# Patient Record
Sex: Female | Born: 1985 | Race: Black or African American | Hispanic: No | Marital: Single | Smoking: Current some day smoker
Health system: Southern US, Community
[De-identification: ages and names within clinical notes are randomized; demographics above are authoritative.]

## PROBLEM LIST (undated history)

## (undated) HISTORY — PX: ECTOPIC PREGNANCY SURGERY: SHX613

---
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
## 2006-12-25 ENCOUNTER — Ambulatory Visit (HOSPITAL_COMMUNITY): Admission: RE | Admit: 2006-12-25 | Discharge: 2006-12-25 | Payer: Self-pay | Admitting: Obstetrics and Gynecology

## 2006-12-25 ENCOUNTER — Inpatient Hospital Stay (HOSPITAL_COMMUNITY): Admission: AD | Admit: 2006-12-25 | Discharge: 2006-12-26 | Payer: Self-pay | Admitting: Obstetrics and Gynecology

## 2007-05-17 ENCOUNTER — Encounter: Payer: Self-pay | Admitting: Obstetrics and Gynecology

## 2007-05-17 ENCOUNTER — Inpatient Hospital Stay (HOSPITAL_COMMUNITY): Admission: EM | Admit: 2007-05-17 | Discharge: 2007-05-18 | Payer: Self-pay | Admitting: Emergency Medicine

## 2008-06-30 IMAGING — US US OB COMP LESS 14 WK
1 series · 14 of 28 positions shown · non-contrast
Comparison: none

CLINICAL DATA: 20-year-old female, abdominal pain, positive pregnancy test.  The patient?s beta hCG is 3823.
TRANSABDOMINAL AND TRANSVAGINAL PELVIC ULTRASOUND:
TECHNIQUE: Both transabdominal and transvaginal ultrasound examinations of the pelvis were performed including evaluation of the uterus, ovaries, adnexal regions, and pelvic cul-de-sac.

[Series 1: us ob comp less 14 wk · 0.28mm/px · 139 acquisitions, 14 frames shown]
[im 6/139]
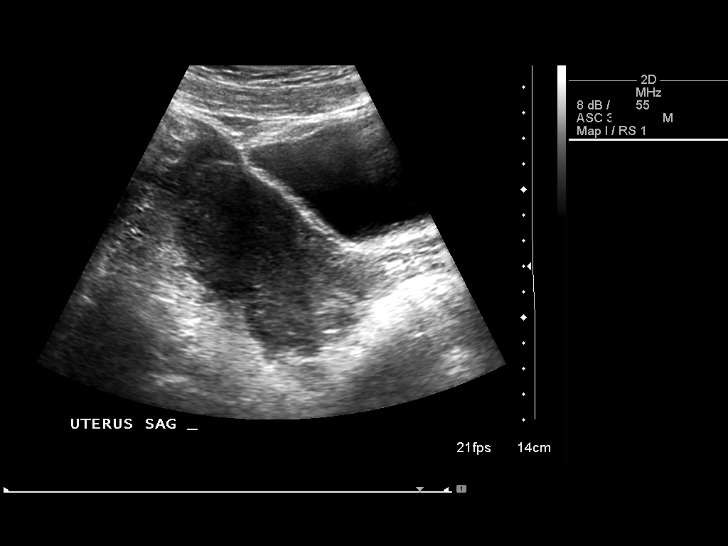
[im 16/139]
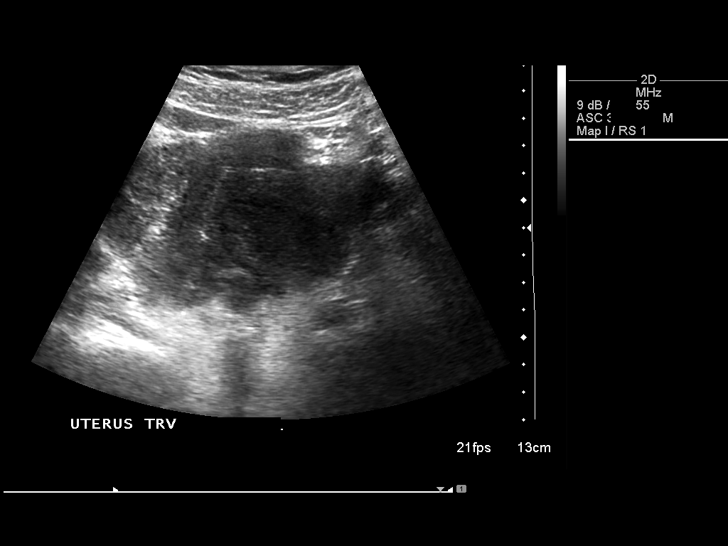
[im 26/139]
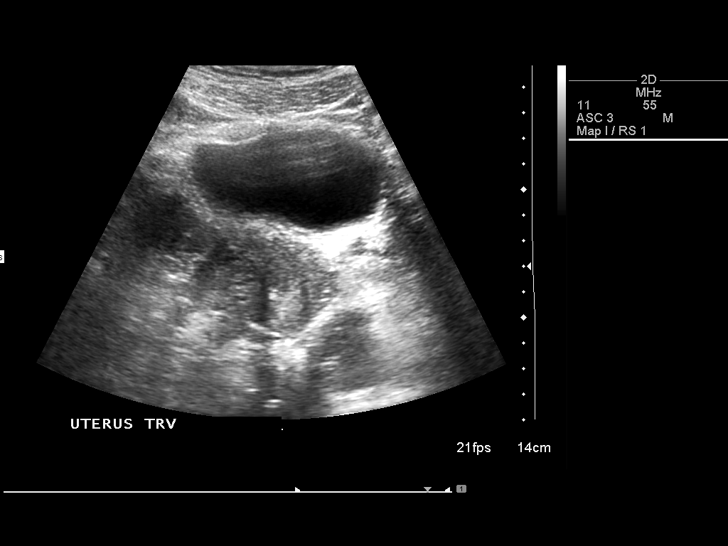
[im 36/139]
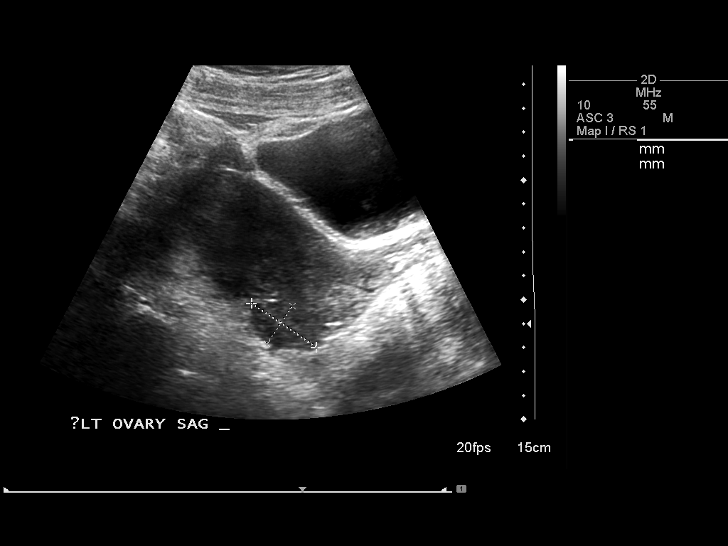
[im 47/139]
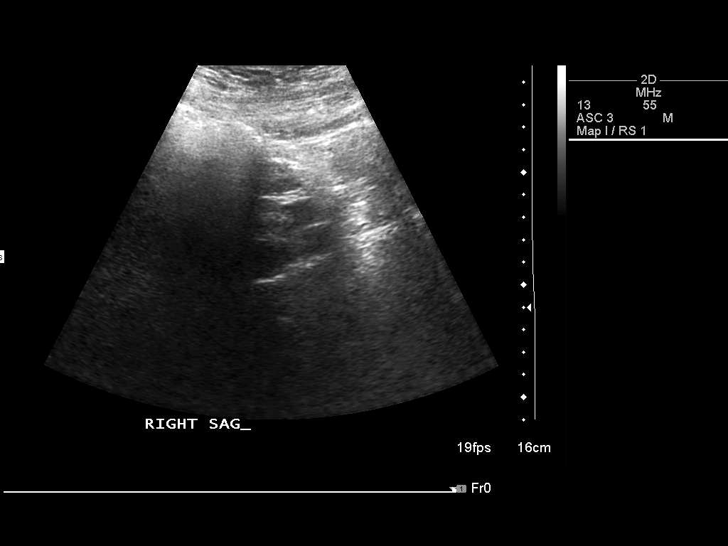
[im 57/139]
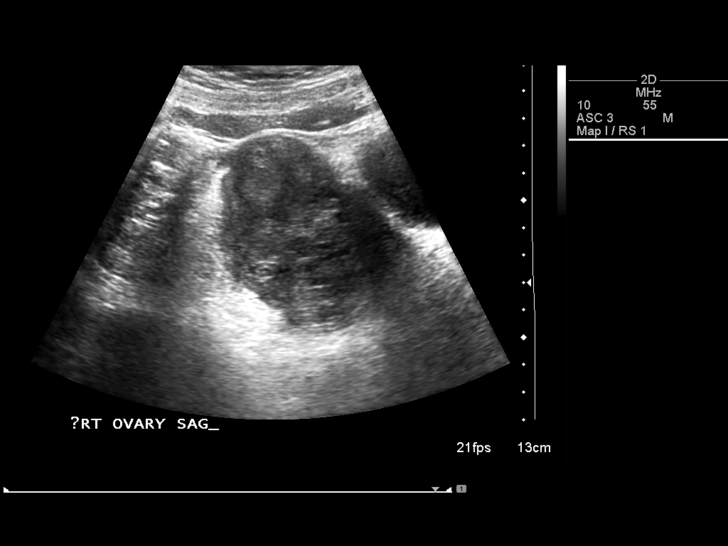
[im 67/139]
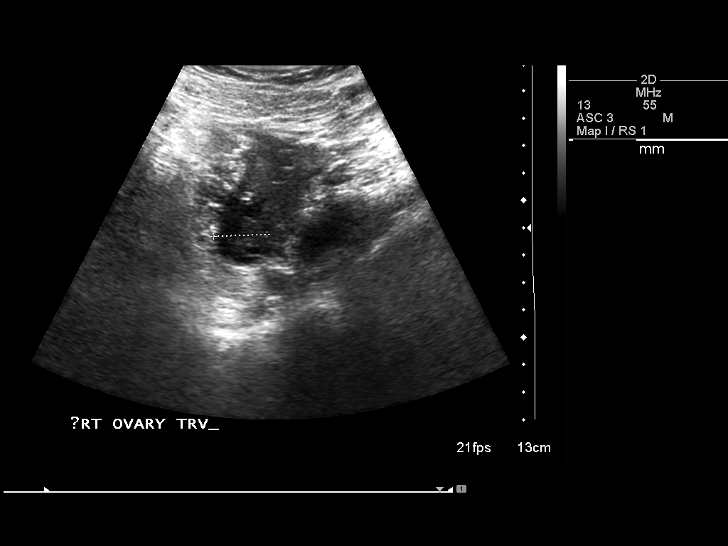
[im 77/139]
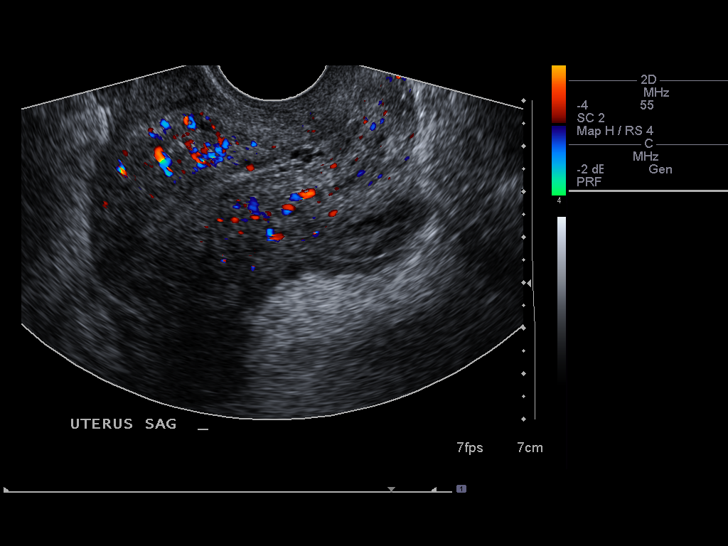
[im 87/139]
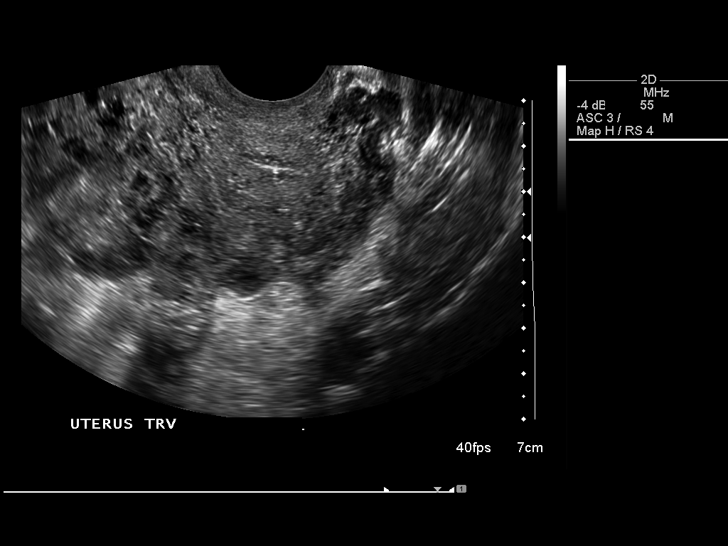
[im 98/139]
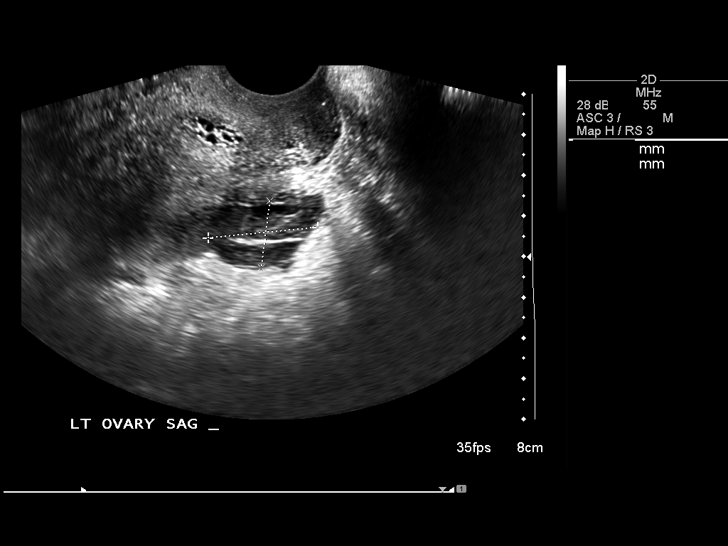
[im 108/139]
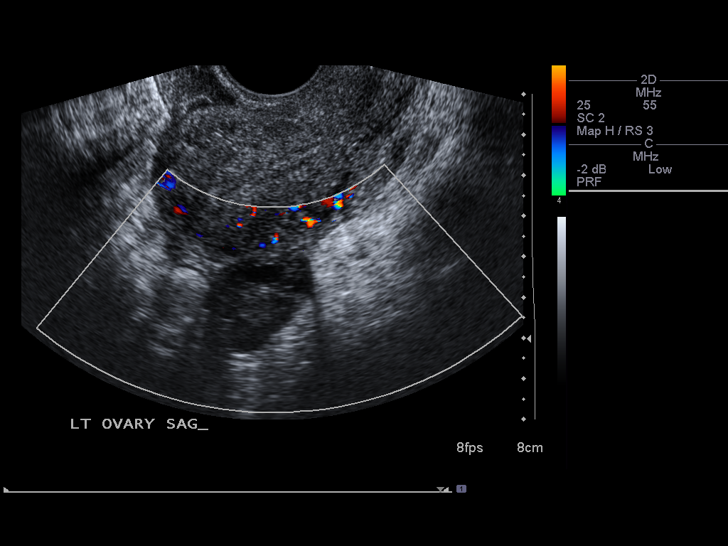
[im 118/139]
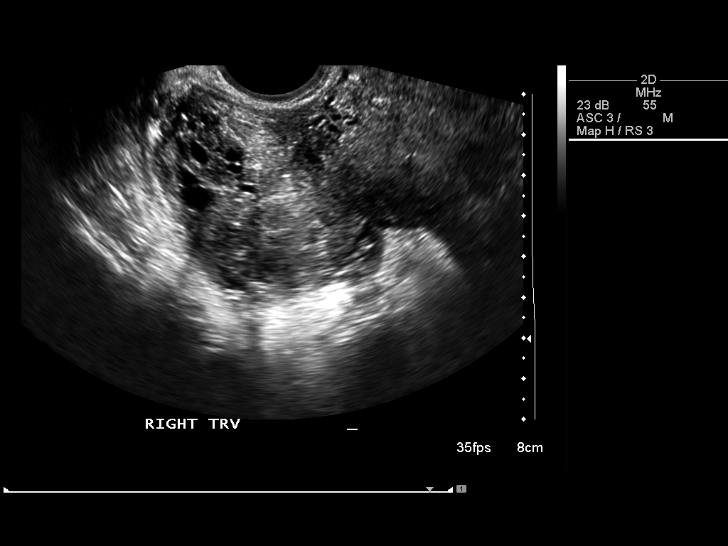
[im 128/139]
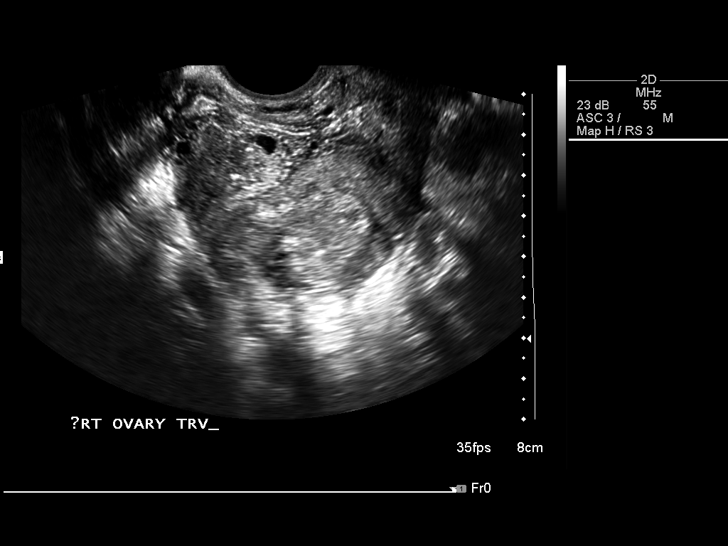
[im 139/139]
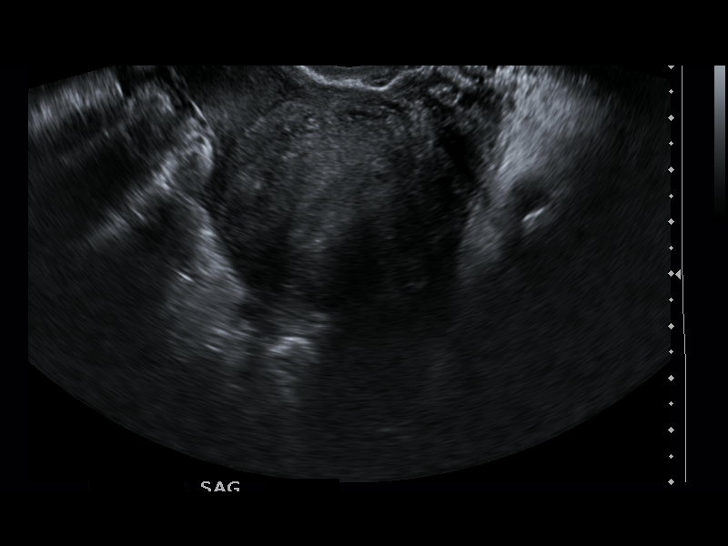

[14 of 28 positions shown; findings below may reference images not displayed]

FINDINGS: The uterus is somewhat heterogenous.  There appears to an exophytic fibroid posteriorly measuring 2.7 x 1.7 x 3.0 cm.  The endometrial stripe is within normal limits at 6 mm.  There is a mass within the right adnexa, which measures 7.5 x 5.1 cm.  The ovary appears to be a component of this with multiple small follicles.  The left ovary is within normal limits measuring 3.0 x 2.1 x 2.4 cm.  A small amount of free fluid with debris is noted.
IMPRESSION: 1.  Right adnexal mass and small amount of free fluid with debris.  onspecific but ectopic pregnancy is not excluded.  
2.  Probable posterior exophytic fibroid of the uterus.  
3.  The findings were discussed with [REDACTED] at the time of the exam.

## 2009-07-07 ENCOUNTER — Emergency Department (HOSPITAL_COMMUNITY): Admission: EM | Admit: 2009-07-07 | Discharge: 2009-07-07 | Payer: Self-pay | Admitting: Emergency Medicine

## 2009-11-27 ENCOUNTER — Emergency Department (HOSPITAL_COMMUNITY): Admission: EM | Admit: 2009-11-27 | Discharge: 2009-11-27 | Payer: Self-pay | Admitting: Emergency Medicine

## 2010-05-21 ENCOUNTER — Emergency Department (HOSPITAL_COMMUNITY): Admission: EM | Admit: 2010-05-21 | Discharge: 2010-05-21 | Payer: Self-pay | Admitting: Emergency Medicine

## 2010-08-22 IMAGING — CR DG ANKLE COMPLETE 3+V*L*
3 series · 3 of 3 positions shown · non-contrast
Comparison: None

CLINICAL DATA: Pain and swelling for a week, no trauma

LEFT ANKLE COMPLETE - 3+ VIEW

[view not recorded (1 of 3)]
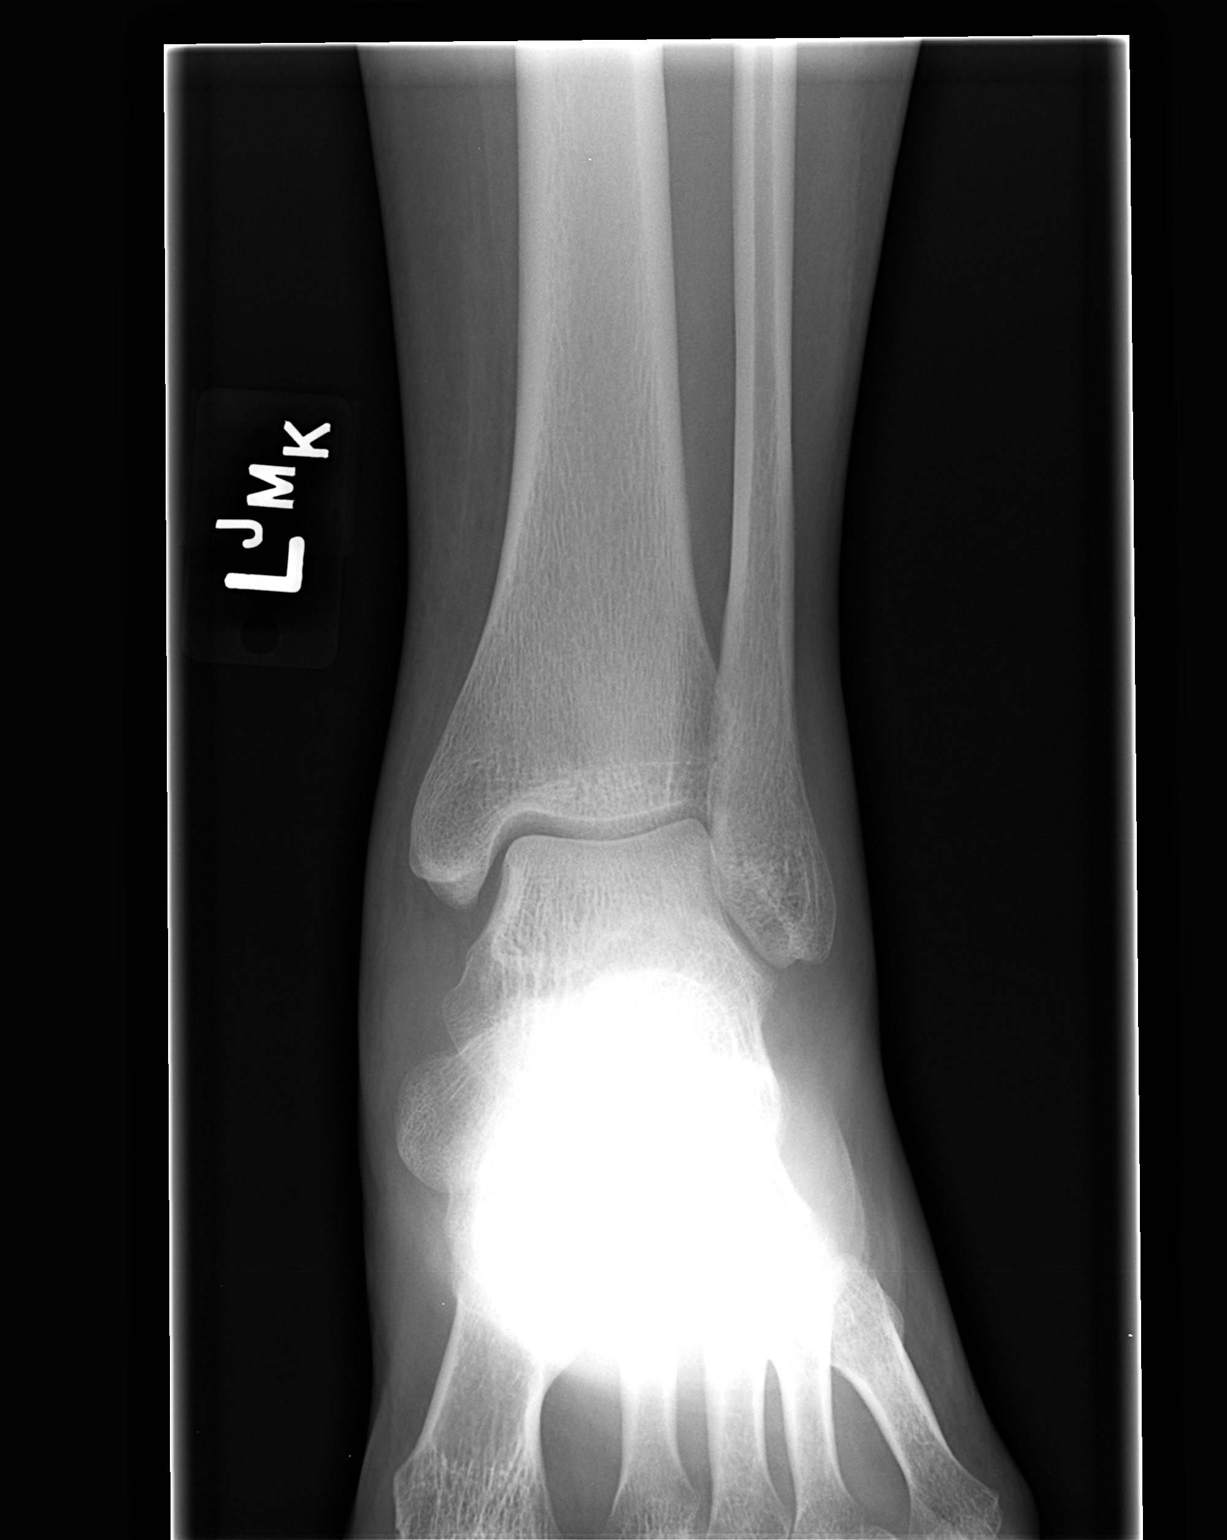

[view not recorded (2 of 3)]
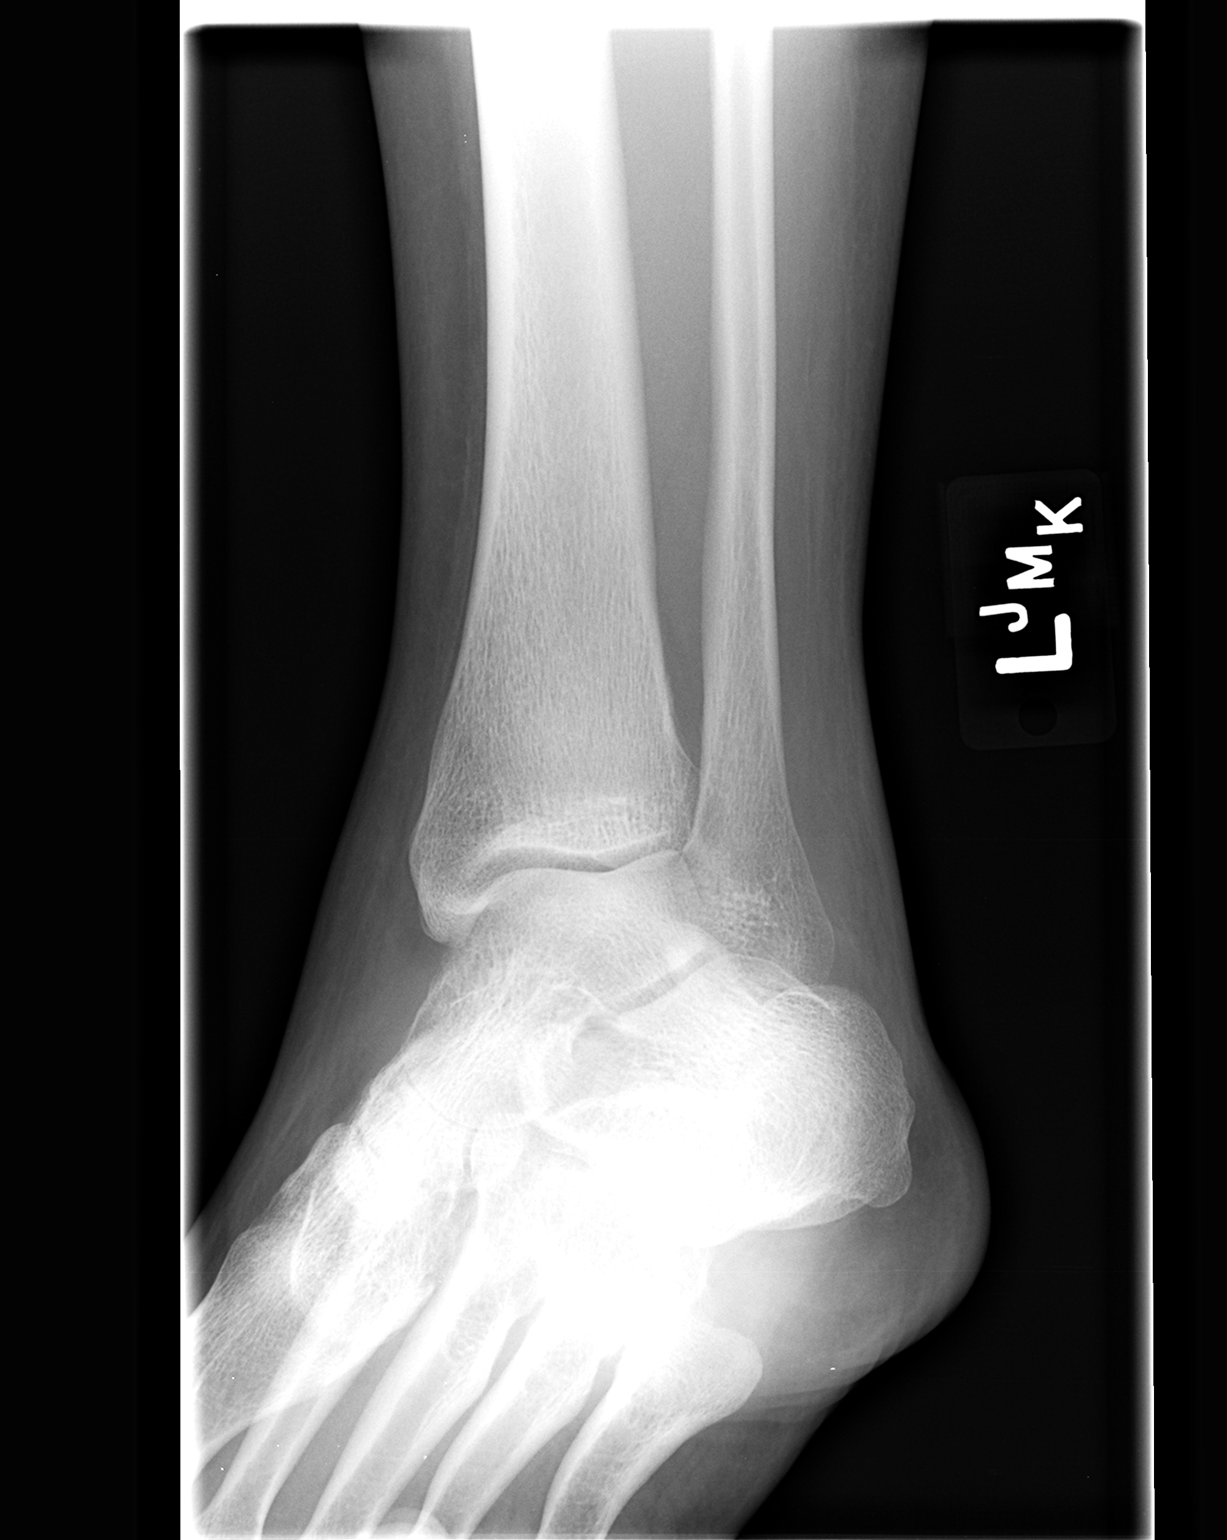

[view not recorded (3 of 3)]
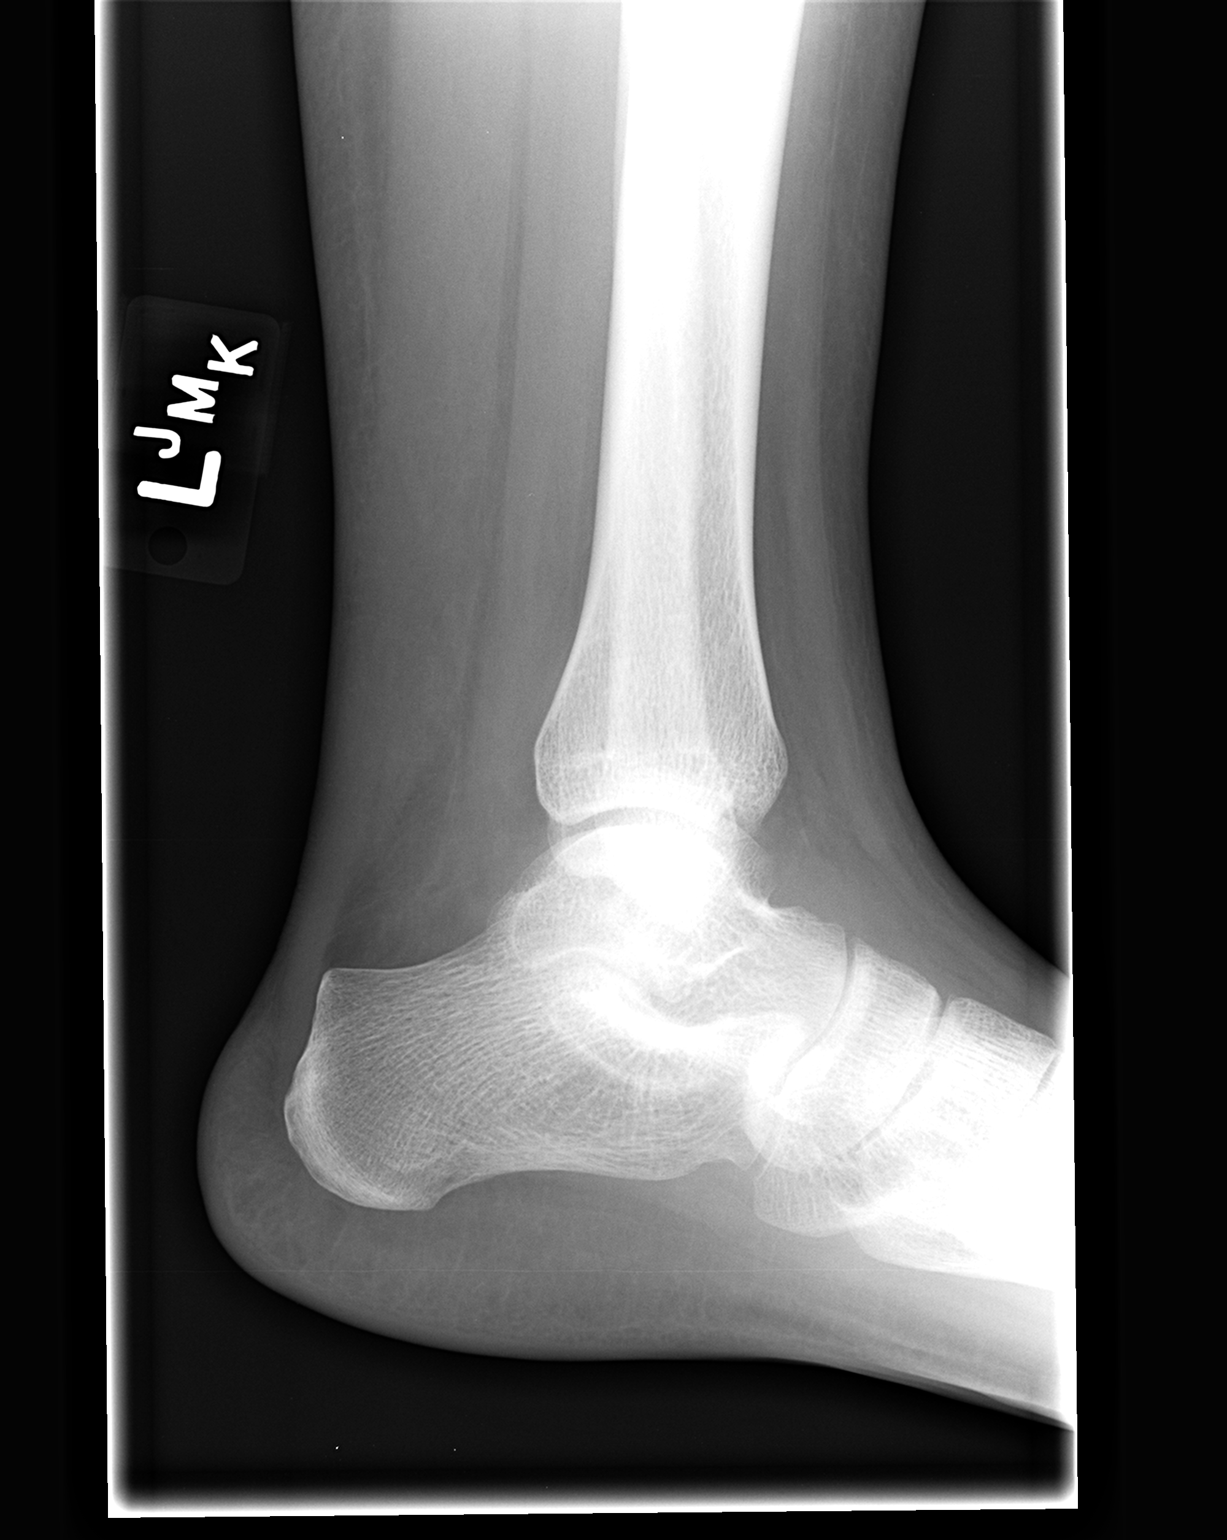

[3 of 3 positions shown; findings below may reference images not displayed]

FINDINGS: No acute fracture is seen.  The ankle joint is normal.
Alignment is normal.
IMPRESSION: No acute bony abnormality.

## 2010-08-22 IMAGING — US US EXTREM LOW VENOUS*L*
1 series · 14 of 24 positions shown · non-contrast
Comparison: None.

CLINICAL DATA: Leg swelling after along periods of standing.



[Series 1: unknown · 14 of 49 slices shown]
[im 1/49]
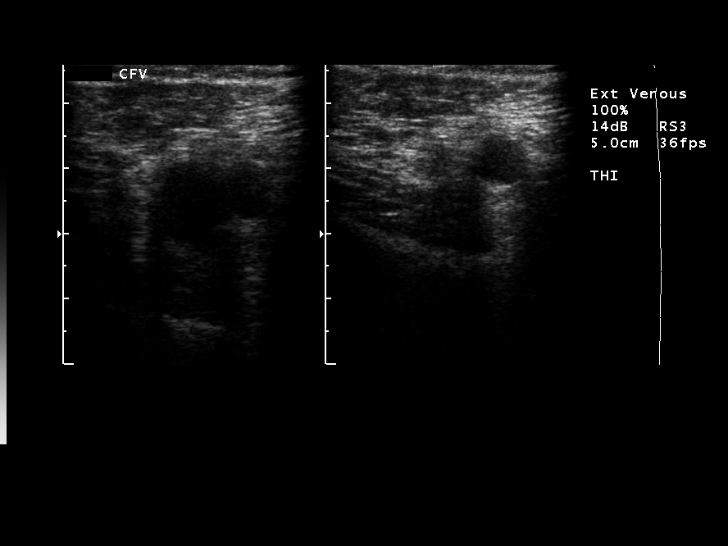
[im 5/49]
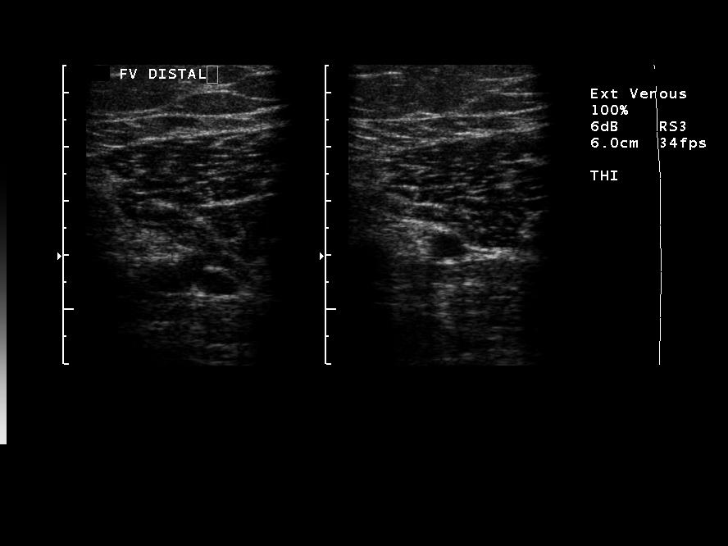
[im 9/49]
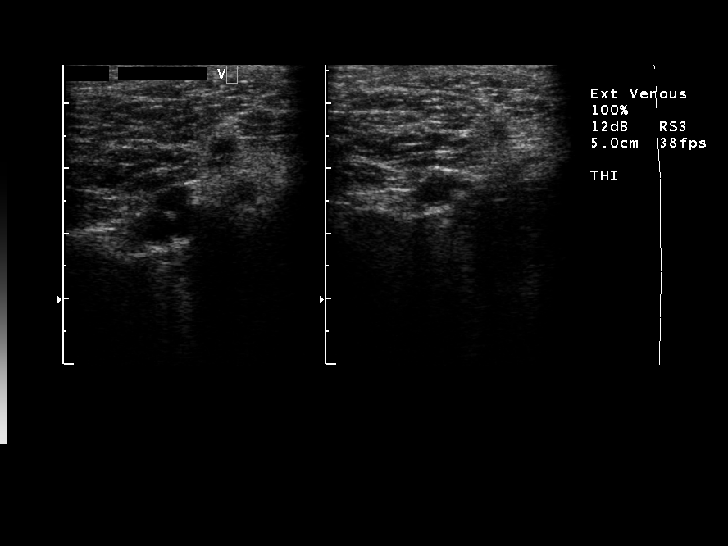
[im 13/49]
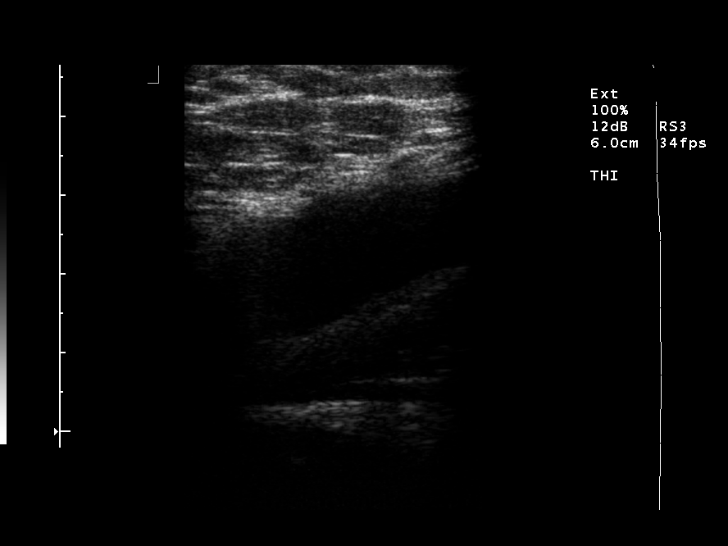
[im 15/49]
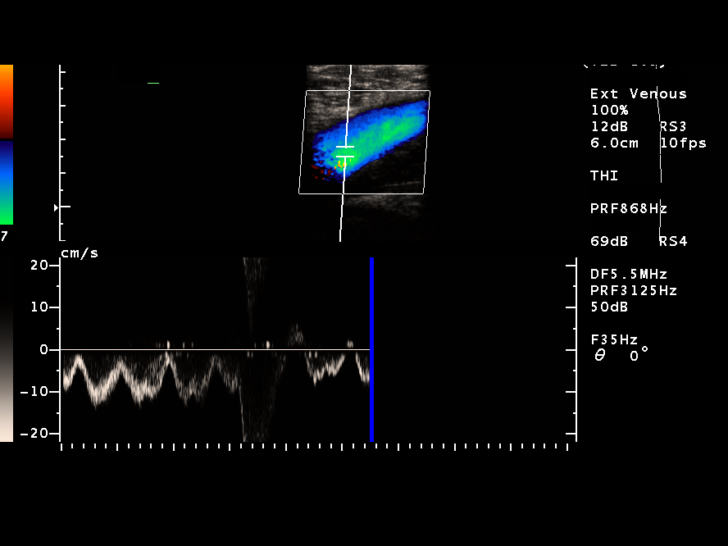
[im 19/49]
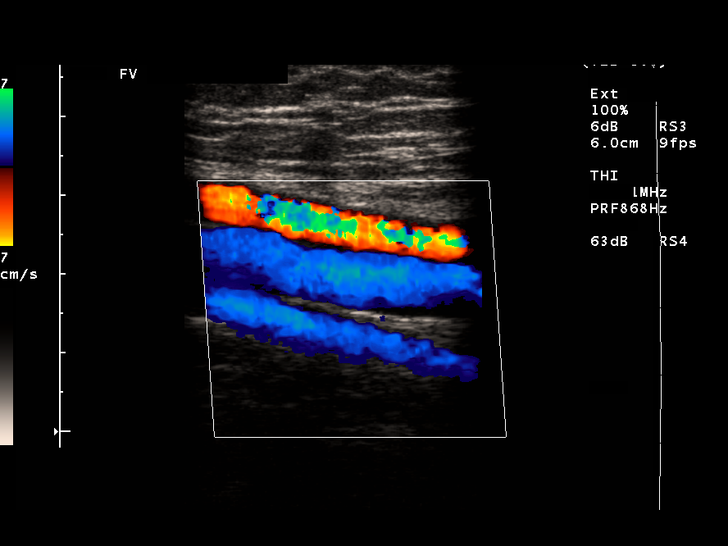
[im 23/49]
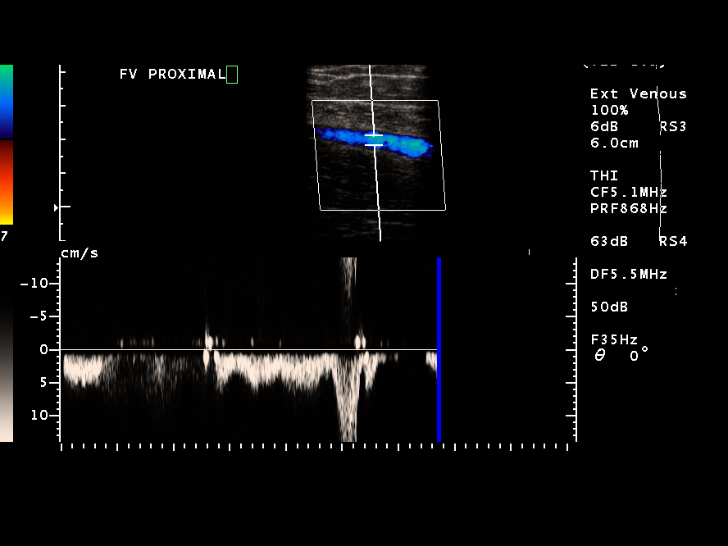
[im 26/49]
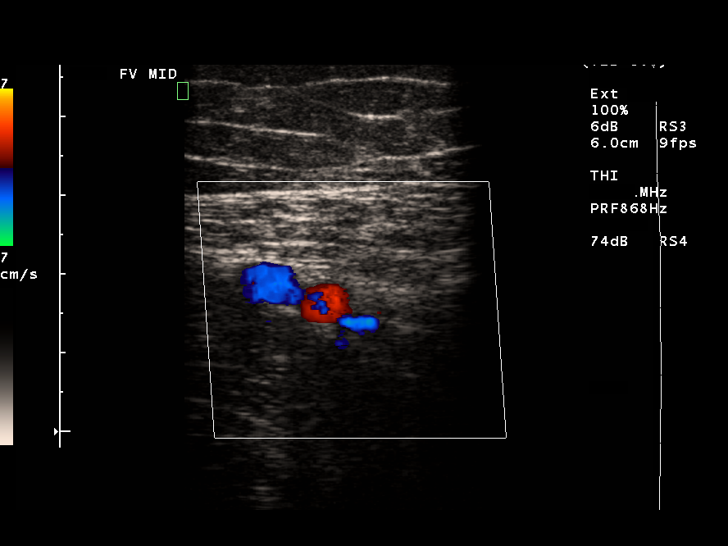
[im 30/49]
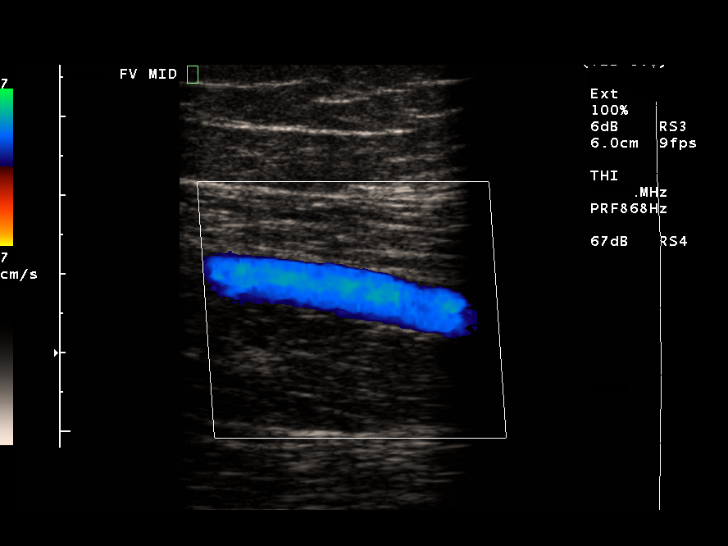
[im 34/49]
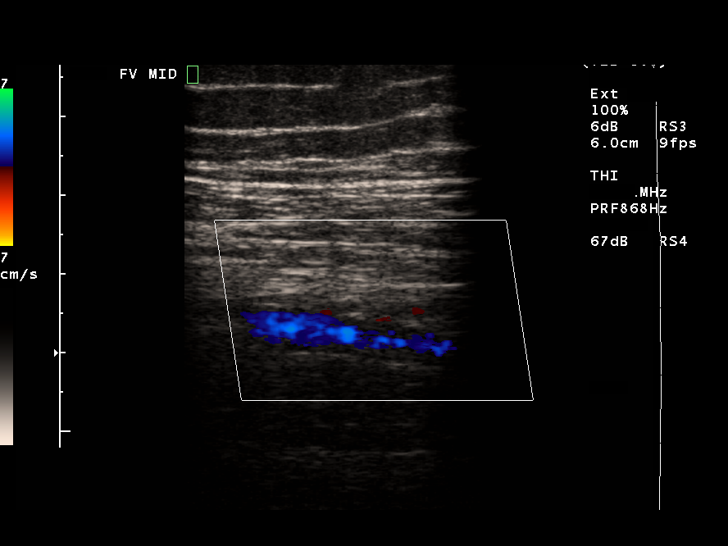
[im 38/49]
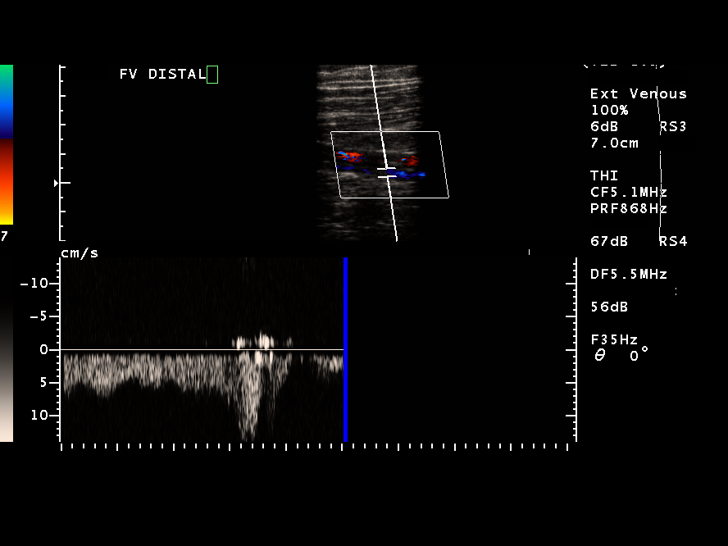
[im 40/49]
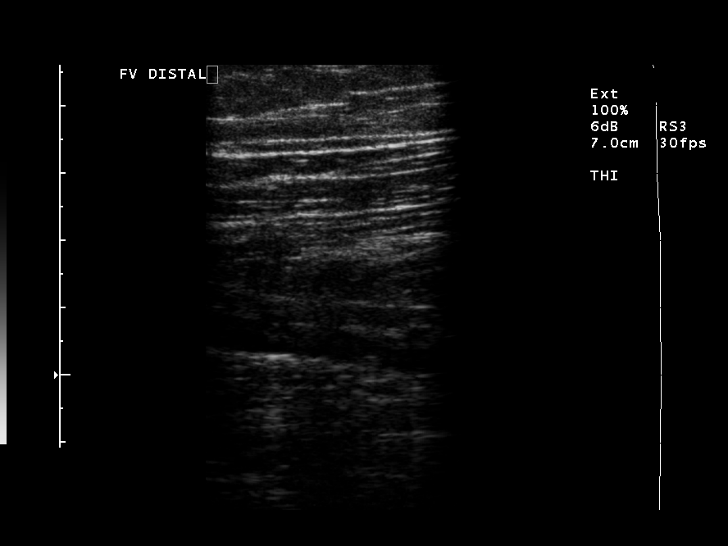
[im 44/49]
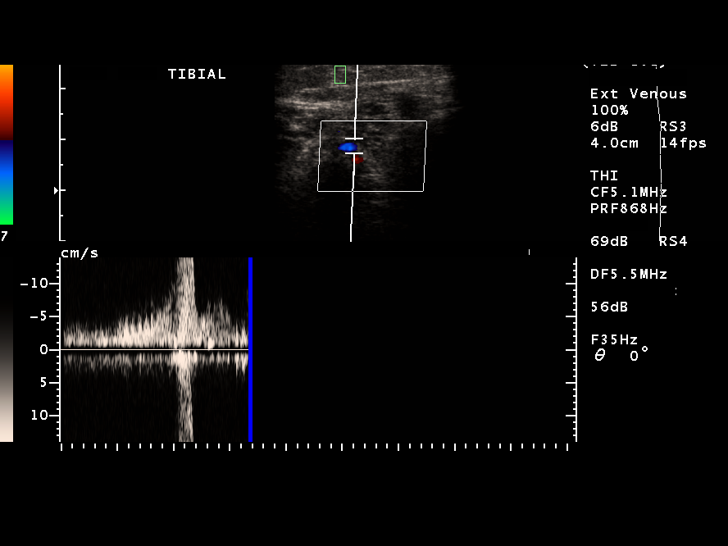
[im 49/49]
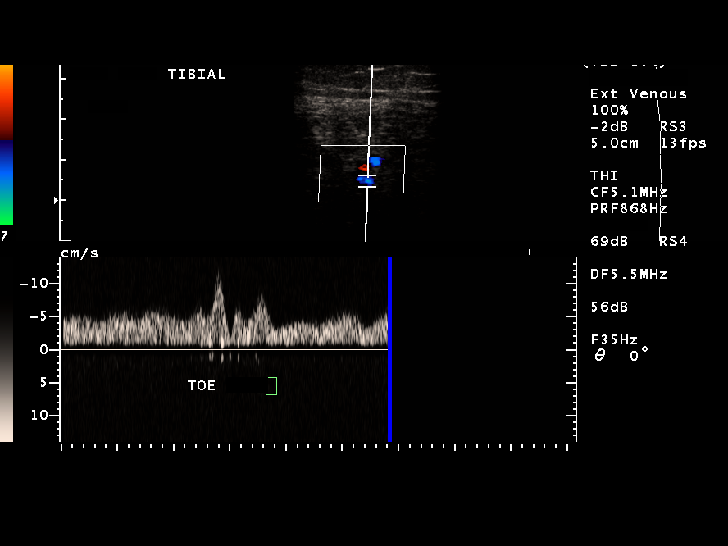

[14 of 24 positions shown; findings below may reference images not displayed]

FINDINGS: From the level of the left common femoral vein to the
upper left calf veins, there is adequate color flow, compression
and  augmentation without findings of a deep venous thrombosis.
IMPRESSION: No evidence of left-sided deep venous thrombosis.

## 2011-01-09 LAB — DIFFERENTIAL
Basophils Absolute: 0 10*3/uL (ref 0.0–0.1)
Basophils Relative: 0 % (ref 0–1)
Eosinophils Absolute: 0.1 10*3/uL (ref 0.0–0.7)
Eosinophils Relative: 3 % (ref 0–5)
Lymphocytes Relative: 35 % (ref 12–46)
Lymphs Abs: 1.6 10*3/uL (ref 0.7–4.0)
Monocytes Absolute: 0.4 10*3/uL (ref 0.1–1.0)
Monocytes Relative: 10 % (ref 3–12)
Neutro Abs: 2.4 10*3/uL (ref 1.7–7.7)
Neutrophils Relative %: 52 % (ref 43–77)

## 2011-01-09 LAB — BASIC METABOLIC PANEL
BUN: 13 mg/dL (ref 6–23)
CO2: 24 mEq/L (ref 19–32)
Calcium: 8.6 mg/dL (ref 8.4–10.5)
Chloride: 105 mEq/L (ref 96–112)
Creatinine, Ser: 0.68 mg/dL (ref 0.4–1.2)
GFR calc Af Amer: >60 mL/min (ref >60)
GFR calc non Af Amer: >60 mL/min (ref >60)
Glucose, Bld: 92 mg/dL (ref 70–99)
Potassium: 3.6 mEq/L (ref 3.5–5.1)
Sodium: 136 mEq/L (ref 135–145)

## 2011-01-09 LAB — CBC
HCT: 31 % — ABNORMAL LOW (ref 36.0–46.0)
Hemoglobin: 10.8 g/dL — ABNORMAL LOW (ref 12.0–15.0)
MCHC: 34.8 g/dL (ref 30.0–36.0)
MCV: 85.6 fL (ref 78.0–100.0)
Platelets: 302 10*3/uL (ref 150–400)
RBC: 3.63 MIL/uL — ABNORMAL LOW (ref 3.87–5.11)
RDW: 12.2 % (ref 11.5–15.5)
WBC: 4.6 10*3/uL (ref 4.0–10.5)

## 2011-02-18 NOTE — Op Note (Signed)
NAMETERRESSA, Catherine Davis                 ACCOUNT NO.:  [id number]   MEDICAL RECORD NO.:  [id number]          PATIENT TYPE:  INP   LOCATION:  A304                          FACILITY:  [facility]   PHYSICIAN:  Tilda Burrow, M.D. DATE OF BIRTH:  1986-08-09   DATE OF PROCEDURE:  05/17/2007  DATE OF DISCHARGE:  05/18/2007                               OPERATIVE REPORT   PREOPERATIVE DIAGNOSIS:  Right ectopic pregnancy.   POSTOPERATIVE DIAGNOSIS:  Right ectopic pregnancy, chronic.   OPERATION PERFORMED:  Laparoscopic right salpingectomy.   SURGEON:  Tilda Burrow, M.D.   ASSISTANT:  None.   ANESTHESIA:  General.   COMPLICATIONS:  None.   INDICATIONS FOR PROCEDURE:  Small left tube, normal left ovary,  extensive adhesions and blood clot in the cul-de-sac.  Right tube is  completely destroyed by large ectopic and edema surrounding it.  There  is lots of old cul-de-sac blood which is scarred down and includes the  right ovary.  As much of the blood as possible was removed.   DESCRIPTION OF PROCEDURE:  The patient was taken to the operating room,  prepped and draped, Foley catheter in place, Hulka tenaculum attached to  the cervix and infraumbilical 1 cm vertical skin incision performed as  well as suprapubic incision of similar length in the right lower  quadrant incision of similar length.  Veress needle was used to enter  the peritoneal cavity, water droplet test used to confirm proper  positioning, pneumoperitoneum under 6 mmHg performed.  Laparoscopic  trocar was directly introduced and revealed blood in the pelvis.  Suprapubic 5 mm trocar was introduced with direct visualization and then  the right lower quadrant 12 mm trocar placed also.  Attention was  directed to the pelvis with irrigation performed as well as blunt  manipulation to free up the large edematous 3 cm right tube from its  attachments to the ovary and cul-de-sac where coagulated and clotted  blood were able to be  disrupted and suctioned out.  Once the tube could  be adequately mobilized, the bipolar cutting device was used to cut  across the broad ligament freeing up the tube in its entirety and  transecting it at its uterine insertion.  This specimen was extracted  through the umbilical site after widening the fascial opening to about 2  cm.   The pelvis was reinspected, confirmed as hemostatic, irrigated,  additional clots were removed from the cul-de-sac.  The pelvis was  irrigated some more and the abdomen deflated.  The fascia closed at the  umbilical site.  The right lower quadrant site was not able to be closed  at the fascia but there was no suspicion of hernia risk due to the Z  tracking technique used during insertion of the trocar.  Suprapubic 5 mm  trocar was similarly only closed at the skin.  Steri-Strips were applied after subcutaneous 3-0 Vicryl closure of all  three sites and staple closure and Steri-Strips placed.  The patient  went to recovery room in excellent condition with minimal blood loss  except for the old  clots and will be discharged later this p.m.  Foley  catheter removed.  Sponge and needle counts were correct.      Tilda Burrow, M.D.  Electronically Signed     JVF/MEDQ  D:  05/17/2007  T:  05/18/2007  Job:  [id number]

## 2011-02-18 NOTE — H&P (Signed)
NAMEMORGIN, HALLS                 ACCOUNT NO.:  [id number]   MEDICAL RECORD NO.:  [id number]          PATIENT TYPE:  INP   LOCATION:  A304                          FACILITY:  [facility]   PHYSICIAN:  Tilda Burrow, M.D. DATE OF BIRTH:  11/10/1985   DATE OF ADMISSION:  05/16/2007  DATE OF DISCHARGE:  LH                              HISTORY & PHYSICAL   ADMITTING DIAGNOSIS:  Right ectopic pregnancy.   HISTORY OF PRESENT ILLNESS:  This 25 year old female, gravida 3, para 2,  with irregular menses x3 in July, LMP July 19 to July 25, was seen in  the emergency room last evening for lower abdominal pain, right slightly  greater than left, but bilateral in etiology.  She was admitted because  of suspected ectopic pregnancy with quantitative hCG of 2005 and empty  uterine cavity.  The ultrasound showed a right adnexal mass, 5 x 7 cm,  without any adnexal fluid.  Options were reviewed, methotrexate versus  surgery; the patient desired surgical therapy.  She does not want any  more children with the possibility of removal of ectopic and possible  removal of entire tube reviewed with the patient.  She plans on no more  children, but she is only 20.  No bleeding at present.  Last meal before  midnight.   PAST MEDICAL HISTORY:  Benign.   PAST SURGICAL HISTORY:  No prior surgery.   ALLERGIES:  None.   OBSTETRICAL HISTORY:  Vaginal delivery x2.   PHYSICAL EXAMINATION:  GENERAL:  Alert, active, relatively comfortable  African American female.  VITAL SIGNS:  Height 5 feet 9 inches, weight approximately 240-260.  HEENT:  Pupils are equal, round and reactive.  Extraocular movements  intact.  NECK:  Supple.  CHEST:  Clear to auscultation.  CARDIAC:  Regular rate.  ABDOMEN:  Nontender to surface palpation.  Abdominal thickness precludes  adequate exam.  PELVIC:  External genitalia normal.  By ER physician's vaginal exam,  normal uterus, anterior.  Ultrasound shows small fibroids, suspected 5 x  7-cm right adnexal mass effect, no cul-de-sac fluid.  EXTREMITIES:  Grossly normal.   LABORATORY DATA:  O-positive blood type.  Hemoglobin 10, hematocrit 32,  white count 5600.   PLAN:  Laparoscopic treatment of ectopic pregnancy, probable  salpingectomy or salpingostomy.      Tilda Burrow, M.D.  Electronically Signed     JVF/MEDQ  D:  05/17/2007  T:  05/18/2007  Job:  [id number]   cc:   [facility]
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
## 2011-02-21 NOTE — Op Note (Signed)
NAME:  Catherine Davis, Catherine Davis                 ACCOUNT NO.:  [id number]   MEDICAL RECORD NO.:  [id number]          PATIENT TYPE:  INP   LOCATION:  LDR1                          FACILITY:  [facility]   PHYSICIAN:  Tilda Burrow, M.D. DATE OF BIRTH:  December 12, 1985   DATE OF PROCEDURE:  DATE OF DISCHARGE:                                  PROCEDURE NOTE   DELIVERY SUMMARY:   ONSET OF LABOR:  October 01, 2005 at 9 a.m.   DATE OF DELIVERY:  October 01, 2005 at 11:30 a.m.   LENGTH OF FIRST STAGE LABOR:  Two hours and 19 minutes.   LENGTH OF SECOND STAGE LABOR:  Eleven minutes.   LENGTH OF THIRD STAGE LABOR:  Five minutes.   DELIVERY NOTE:  Catherine Davis had a normal spontaneous vaginal delivery of a viable  female infant, Apgar's 9 and 9, weight is 8 pounds 11 ounces.  On delivery  of head, rotation was noted and shoulders delivered without complication.  Upon delivery, the infant had movement of all extremities.  Pinked up well.  Vigorous tone and cry.  Mouth was thoroughly suctioned and infant dried and  cord cut and placed to the mother's abdomen for newborn care.  Upon  inspection, there is a one degree perineal laceration noted.  The third  stage of labor was actually managed with 20 units of Pitocin, 1,000 cc of D-  5 LR at a rapid rate.  Placenta was delivered spontaneously via __________  mechanism.  Membranes are noted to be intact and a three-vessel cord is  noted upon inspection.  Estimated blood loss was approximately 350 cc.  First degree laceration was repaired with 5 cc of 1% lidocaine as infiltrate  and repaired with 2-0 suture with a subcuticular stitch to close.      Catherine Davis, Catherine Davis      Tilda Burrow, M.D.  Electronically Signed    DL/MEDQ  D:  87/56/4332  T:  10/01/2005  Job:  [id number]   cc:   Gerda Diss, Dr.

## 2011-02-21 NOTE — Op Note (Signed)
NAMEGIZELLA, Catherine Davis                 ACCOUNT NO.:  [id number]   MEDICAL RECORD NO.:  [id number]          PATIENT TYPE:  INP   LOCATION:  A403                          FACILITY:  [facility]   PHYSICIAN:  Lazaro Arms, M.D.   DATE OF BIRTH:  May 09, 1986   DATE OF PROCEDURE:  12/25/2006  DATE OF DISCHARGE:                               OPERATIVE REPORT   DELIVERY NOTE:  Catherine Davis is a 25 year old gravida 2, para 1 at 59 and 5/[redacted]  weeks gestation who presented this morning in active labor.  She was 6  cm.  Did an amniotomy.  She had one dose of IV pain medicine.  She  quickly progressed through the active phase of labor.  Really pushed one  time.  Delivered a viable female infant at 11:37 with Apgar's of 9 and 9  weighing 8 pounds 11 ounces.  There was a three vessel cord.  Cord blood  and cord gas was sent.  Placenta was normal intact.  Uterus was firm  below the umbilicus.  Perineum was intact.  The infant underwent routine  neonatal resuscitation.  The patient will undergo to routine postpartum  care as well.      Lazaro Arms, M.D.  Electronically Signed     LHE/MEDQ  D:  12/25/2006  T:  12/25/2006  Job:  [id number]

## 2011-02-21 NOTE — Consult Note (Signed)
NAME:  WANETTE, ROBISON                 ACCOUNT NO.:  [id number]   MEDICAL RECORD NO.:  [id number]          PATIENT TYPE:  OIB   LOCATION:  LDR3                          FACILITY:  [facility]   PHYSICIAN:  Tilda Burrow, M.D. DATE OF BIRTH:  1986-07-09   DATE OF CONSULTATION:  DATE OF DISCHARGE:  09/23/2005                                   CONSULTATION   [name] was sent from a routine office visit with her cervix being 3-4 cm,  80% effaced, 0 station per Zerita Boers, N.M.  This is a slight change  from last week's exam of 3 cm, 80% effaced, -1 station.  She was complaining  of some back ache, so she was sent to rule out labor.  She had one  contraction while she was here for an hour and a half.  Her cervix was not  changed, and so she was discharged home with instructions to come back if  the contractions get regular and frequent if her water breaks or any other  problems that she may be having.  Otherwise, she is going to keep her  regularly scheduled visit, and we did set a date for elective induction on  December 27, if she is not delivered by then.      [name], C.N.M.      Tilda Burrow, M.D.  Electronically Signed    FC/MEDQ  D:  09/23/2005  T:  09/24/2005  Job:  [id number]   cc:   [facility]

## 2011-02-21 NOTE — H&P (Signed)
NAME:  Catherine Davis, Catherine Davis                 ACCOUNT NO.:  [id number]   MEDICAL RECORD NO.:  [id number]          PATIENT TYPE:  INP   LOCATION:  LDR1                          FACILITY:  [facility]   PHYSICIAN:  Lazaro Arms, M.D.   DATE OF BIRTH:  20-Feb-1986   DATE OF ADMISSION:  12/25/2006  DATE OF DISCHARGE:  LH                              HISTORY & PHYSICAL   Catherine Davis is a 25 year old African American female, gravida 2, para 1,  estimated date of delivery January 11, 2007, currently at 37-5/7th weeks'  gestation who is admitted in active labor.  She was here earlier and was  found to be in prodromal labor.  Her cervix is now 6-cm, 100%, and minus  one station with a bulging bag of water.   PAST MEDICAL HISTORY:  Negative.   PAST SURGICAL HISTORY:  Negative.   PAST OBSTETRICAL HISTORY:  She delivered back in December 2006, an 8  pounds 11 ounces baby, vaginally without difficulty.   REVIEW OF SYSTEMS:  Negative.   MEDICATIONS:  Prenatal vitamins.   She is O positive.  Rubella immune.  Hepatitis B was negative.  Group B  strep was negative.   IMPRESSION:  1. Intrauterine pregnancy at 37-5/7th weeks' gestation.  2. Active labor.   PLAN:  Expectant management for an SVD.      Lazaro Arms, M.D.  Electronically Signed     LHE/MEDQ  D:  12/25/2006  T:  12/25/2006  Job:  [id number]

## 2011-02-21 NOTE — H&P (Signed)
NAME:  Catherine Davis, Catherine Davis                 ACCOUNT NO.:  [id number]   MEDICAL RECORD NO.:  [id number]          PATIENT TYPE:  INP   LOCATION:  LDR1                          FACILITY:  [facility]   PHYSICIAN:  Tilda Burrow, M.D. DATE OF BIRTH:  1986/08/22   DATE OF ADMISSION:  10/01/2005  DATE OF DISCHARGE:  LH                                HISTORY & PHYSICAL   REASON FOR ADMISSION:  Pregnancy at 40 weeks for elective induction of labor  due to maternal fatigue and discomfort.   HISTORY OF PRESENT ILLNESS:  Catherine Davis is an 25 year old, primigravida who has  had prodromal labor during the past week and who has progressed up to 3-4  cm, 90% effaced and 0 station.  After careful discussion with the patient,  the patient is aware of risks and benefits of elective induction and desires  elective induction.   PAST MEDICAL HISTORY:  Negative.   PAST SURGICAL HISTORY:  Negative.   ALLERGIES:  No known drug allergies.   FAMILY HISTORY:  Positive for hypertension, diabetes and cancer.   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   PRENATAL COURSE:  Essentially uneventful.  She did have some failed  appointments where she left and went to [place] and came back at that time.   PRENATAL LABORATORY DATA:  Drug screen is positive for cocaine initially.  Blood type O positive.  Rubella immune.  Hepatitis B surface antigen  negative.  HIV negative.  Serology nonreactive.  GC and Chlamydia negative.  Repeat GC and Chlamydia negative.  GBS is positive.  Three-hour glucose  shows 70, 118, 80 and 104.   PHYSICAL EXAMINATION:  VITAL SIGNS:  Weight 244 pounds, blood pressure  120/66.  ABDOMEN:  There is good fetal movement.  Fundal height is 38 cm.  Fetal  heart rate is strong and regular at 140  beats per minute.  PELVIC:  Cervix is 3-4 cm, 90% effaced and 0 to +1 station.   PLAN:  We are going to admit for Pitocin induction of labor.      Zerita Boers,  Lanier Clam      Tilda Burrow, M.D.  Electronically Signed    DL/MEDQ  D:  40/98/1191  T:  09/30/2005  Job:  [id number]   cc:   Francoise Schaumann. Milford Cage DO, FAAP  Fax: [phone]

## 2011-02-21 NOTE — H&P (Signed)
NAME:  Catherine Davis, Catherine Davis                 ACCOUNT NO.:  [id number]   MEDICAL RECORD NO.:  [id number]          PATIENT TYPE:  OIB   LOCATION:  LDR3                          FACILITY:  [facility]   PHYSICIAN:  Tilda Burrow, M.D. DATE OF BIRTH:  12-30-1985   DATE OF ADMISSION:  09/23/2005  DATE OF DISCHARGE:  12/19/2006LH                                HISTORY & PHYSICAL   REASON FOR ADMISSION:  Pregnancy at 38-7/7 weeks with complaints of  contractions during the course of the night and cervix is dilated 3-4 cm.   PAST MEDICAL HISTORY:  Negative.   PAST SURGICAL HISTORY:  Negative.   ALLERGIES:  No known drug allergies.   FAMILY HISTORY:  Positive for hypertension, diabetes, cancer.   PRENATAL COURSE:  Uneventful.  She has had limited prenatal care.  Blood  type is O positive.  UDS is positive for cocaine on initial but has been  negative with subsequent follow up.  Rubella is immune.  Hepatitis B surface  antigen negative, HIV negative, serology nonreactive, GC/Chlamydia negative.  GBS is positive.  A 3-hour glucose is as follows:  71, 18, 80, 104.   PHYSICAL EXAMINATION:  VITAL SIGNS:  Weight 240, blood pressure 112/72.  ABDOMEN:  Fundal height is 38 cm.  Fetal heart rate 150, strong and regular.  Cervix is 3-4 cm, 90% effaced and 0 station.   PLAN:  Admit to observation.      Zerita Boers, Lanier Clam      Tilda Burrow, M.D.  Electronically Signed   DL/MEDQ  D:  40/07/2724  T:  09/23/2005  Job:  [id number]   cc:   Francoise Schaumann. Halford Chessman  Fax: [phone]

## 2011-02-21 NOTE — Op Note (Signed)
NAME:  Catherine Davis, Catherine Davis                 ACCOUNT NO.:  [id number]   MEDICAL RECORD NO.:  [id number]          PATIENT TYPE:  OIB   LOCATION:  A415                          FACILITY:  [facility]   PHYSICIAN:  Tilda Burrow, M.D. DATE OF BIRTH:  26-Dec-1985   DATE OF PROCEDURE:  DATE OF DISCHARGE:                                 OPERATIVE REPORT   [name] had a regularly scheduled prenatal visit this morning and a routine  cervical exam at 38 weeks showed her to be 3 cm, 75% effaced, -1 station.  She was complaining of some crampiness and back pain, and so I sent her over  here to rule out labor. She is not having any contractions on the monitor.  She did walk around for a while, and I checked her again at 1 p.m. which was  about 3 hours after her first cervical exam, and there was no change. Fetal  heart rate is reactive. She was discharged home with instructions to come  back if she has ruptured membranes, heavy vaginal bleeding, decreased fetal  movement or any other problems she is concerned or if she does start to have  regular contractions.      [name], C.N.M.      Tilda Burrow, M.D.  Electronically Signed    FC/MEDQ  D:  09/16/2005  T:  09/16/2005  Job:  [id number]

## 2011-07-21 LAB — URINALYSIS, ROUTINE W REFLEX MICROSCOPIC
Bilirubin Urine: NEGATIVE
Glucose, UA: NEGATIVE
Hgb urine dipstick: NEGATIVE
Ketones, ur: NEGATIVE
Leukocytes, UA: NEGATIVE
Nitrite: POSITIVE — AB
Specific Gravity, Urine: >1.03 — ABNORMAL HIGH
Urobilinogen, UA: 1
pH: 6

## 2011-07-21 LAB — DIFFERENTIAL
Basophils Absolute: 0
Basophils Absolute: 0
Basophils Relative: 0
Basophils Relative: 1
Eosinophils Absolute: 0.1
Eosinophils Absolute: 0.1
Eosinophils Relative: 1
Eosinophils Relative: 2
Lymphocytes Relative: 17
Lymphocytes Relative: 29
Lymphs Abs: 1.3
Lymphs Abs: 1.6
Monocytes Absolute: 0.5
Monocytes Absolute: 0.7
Monocytes Relative: 12 — ABNORMAL HIGH
Monocytes Relative: 7
Neutro Abs: 3.2
Neutro Abs: 5.7
Neutrophils Relative %: 56
Neutrophils Relative %: 75

## 2011-07-21 LAB — CBC
HCT: 32.5 — ABNORMAL LOW
HCT: 35.9 — ABNORMAL LOW
Hemoglobin: 10.9 — ABNORMAL LOW
Hemoglobin: 12
MCHC: 33.5
MCHC: 33.7
MCV: 84.1
MCV: 84.3
Platelets: 273
Platelets: 303
RBC: 3.86 — ABNORMAL LOW
RBC: 4.26
RDW: 12.7
RDW: 13.1
WBC: 5.6
WBC: 7.7

## 2011-07-21 LAB — CROSSMATCH
ABO/RH(D): O POS
Antibody Screen: NEGATIVE

## 2011-07-21 LAB — HEPATIC FUNCTION PANEL
ALT: 10
AST: 15
Albumin: 3.3 — ABNORMAL LOW
Alkaline Phosphatase: 97
Bilirubin, Direct: 0.1
Indirect Bilirubin: 0.6
Total Bilirubin: 0.7
Total Protein: 6.9

## 2011-07-21 LAB — URINE MICROSCOPIC-ADD ON

## 2011-07-21 LAB — ABO/RH: ABO/RH(D): O POS

## 2011-07-21 LAB — PREGNANCY, URINE: Preg Test, Ur: POSITIVE

## 2011-07-21 LAB — HCG, QUANTITATIVE, PREGNANCY: hCG, Beta Chain, Quant, S: 2012 — ABNORMAL HIGH

## 2012-12-03 ENCOUNTER — Emergency Department (HOSPITAL_COMMUNITY)
Admission: EM | Admit: 2012-12-03 | Discharge: 2012-12-03 | Disposition: A | Discharge status: Home / Self Care | Payer: 59 | Attending: Emergency Medicine | Admitting: Emergency Medicine

## 2012-12-03 ENCOUNTER — Encounter (HOSPITAL_COMMUNITY): Payer: Self-pay | Admitting: *Deleted

## 2012-12-03 DIAGNOSIS — R1013 Epigastric pain: Secondary | ICD-10-CM

## 2012-12-03 DIAGNOSIS — Z3202 Encounter for pregnancy test, result negative: Secondary | ICD-10-CM | POA: Insufficient documentation

## 2012-12-03 DIAGNOSIS — F17200 Nicotine dependence, unspecified, uncomplicated: Secondary | ICD-10-CM | POA: Insufficient documentation

## 2012-12-03 LAB — PREGNANCY, URINE: Preg Test, Ur: NEGATIVE

## 2012-12-03 LAB — COMPREHENSIVE METABOLIC PANEL
ALT: 10 U/L (ref 0–35)
AST: 16 U/L (ref 0–37)
Albumin: 4.2 g/dL (ref 3.5–5.2)
Calcium: 9.6 mg/dL (ref 8.4–10.5)
GFR calc Af Amer: >90 mL/min (ref >90)
Glucose, Bld: 91 mg/dL (ref 70–99)
Sodium: 137 mEq/L (ref 135–145)
Total Protein: 8.2 g/dL (ref 6.0–8.3)

## 2012-12-03 LAB — URINALYSIS, ROUTINE W REFLEX MICROSCOPIC
Glucose, UA: NEGATIVE mg/dL
Hgb urine dipstick: NEGATIVE
Specific Gravity, Urine: 1.025 (ref 1.005–1.030)

## 2012-12-03 LAB — CBC WITH DIFFERENTIAL/PLATELET
Basophils Relative: 0 % (ref 0–1)
HCT: 37.8 % (ref 36.0–46.0)
Hemoglobin: 13.1 g/dL (ref 12.0–15.0)
Lymphocytes Relative: 35 % (ref 12–46)
MCHC: 34.7 g/dL (ref 30.0–36.0)
Monocytes Absolute: 0.4 10*3/uL (ref 0.1–1.0)
Monocytes Relative: 7 % (ref 3–12)
Neutro Abs: 3.1 10*3/uL (ref 1.7–7.7)

## 2012-12-03 MED ORDER — FAMOTIDINE 20 MG PO TABS: 20.0000 mg | ORAL_TABLET | Freq: Two times a day (BID) | ORAL | Status: DC

## 2012-12-03 MED ORDER — PROMETHAZINE HCL 25 MG PO TABS: 25.0000 mg | ORAL_TABLET | Freq: Four times a day (QID) | ORAL | Status: DC | PRN

## 2012-12-03 NOTE — ED Provider Notes (Signed)
History     CSN: [id number]  Arrival date & time 12/03/12  1914   First MD Initiated Contact with Patient 12/03/12 1938      Chief Complaint  Patient presents with  . Abdominal Pain    (Consider location/radiation/quality/duration/timing/severity/associated sxs/prior treatment) HPI... Epigastric pain with radiation to back since 7:30 this morning. Decreased food intake. Mother has a history of gallbladder disease. Last menstrual period February 10. No vomiting, dysuria, vaginal complaints. Negative past medical history. Nothing makes symptoms better or worse. Severity is mild to moderate  History reviewed. No pertinent past medical history.  Past Surgical History  Procedure Laterality Date  . Ectopic pregnancy surgery      History reviewed. No pertinent family history.  History  Substance Use Topics  . Smoking status: Current Every Day Smoker  . Smokeless tobacco: Not on file  . Alcohol Use: No    OB History   Grav Para Term Preterm Abortions TAB SAB Ect Mult Living                  Review of Systems  All other systems reviewed and are negative.    Allergies  Review of patient's allergies indicates no known allergies.  Home Medications   Current Outpatient Rx  Name  Route  Sig  Dispense  Refill  . calcium carbonate (TUMS EX) 750 MG chewable tablet   Oral   Chew 1 tablet by mouth as needed for heartburn.         . famotidine (PEPCID) 20 MG tablet   Oral   Take 1 tablet (20 mg total) by mouth 2 (two) times daily.   30 tablet   0   . promethazine (PHENERGAN) 25 MG tablet   Oral   Take 1 tablet (25 mg total) by mouth every 6 (six) hours as needed for nausea.   15 tablet   0     BP 121/76  Pulse 82  Temp(Src) 97.9 F (36.6 C) (Oral)  Resp 20  Ht 5\' 10"  (1.778 m)  Wt 238 lb (107.956 kg)  BMI 34.15 kg/m2  SpO2 99%  LMP 11/15/2012  Physical Exam  Nursing note and vitals reviewed. Constitutional: She is oriented to person, place, and time.  She appears well-developed and well-nourished.  HENT:  Head: Normocephalic and atraumatic.  Eyes: Conjunctivae and EOM are normal. Pupils are equal, round, and reactive to light.  Neck: Normal range of motion. Neck supple.  Cardiovascular: Normal rate, regular rhythm and normal heart sounds.   Pulmonary/Chest: Effort normal and breath sounds normal.  Abdominal:  Slight epigastric tenderness  Musculoskeletal: Normal range of motion.  Neurological: She is alert and oriented to person, place, and time.  Skin: Skin is warm and dry.  Psychiatric: She has a normal mood and affect.    ED Course  Procedures (including critical care time)  Labs Reviewed  COMPREHENSIVE METABOLIC PANEL - Abnormal; Notable for the following:    Total Bilirubin 0.2 (*)    All other components within normal limits  CBC WITH DIFFERENTIAL  LIPASE, BLOOD  URINALYSIS, ROUTINE W REFLEX MICROSCOPIC  PREGNANCY, URINE   No results found.   1. Epigastric pain       MDM  No acute abdomen. Screening blood work negative. Outpatient ultrasound tomorrow to rule out cholelithiasis        Donnetta Hutching, MD 12/03/12 2112

## 2012-12-03 NOTE — ED Notes (Signed)
Pt ambulated to restroom at this time without any problems. Catherine Davis

## 2012-12-03 NOTE — ED Notes (Signed)
Upper abd pain that goes around to back,  Nausea, no diarrhea,

## 2012-12-04 ENCOUNTER — Other Ambulatory Visit (HOSPITAL_COMMUNITY): Payer: 59

## 2013-03-07 ENCOUNTER — Emergency Department (HOSPITAL_COMMUNITY)
Admission: EM | Admit: 2013-03-07 | Discharge: 2013-03-07 | Discharge status: Against Medical Advice | Payer: 59 | Attending: Emergency Medicine | Admitting: Emergency Medicine

## 2013-03-07 ENCOUNTER — Encounter (HOSPITAL_COMMUNITY): Payer: Self-pay | Admitting: Emergency Medicine

## 2013-03-07 DIAGNOSIS — L0291 Cutaneous abscess, unspecified: Secondary | ICD-10-CM | POA: Insufficient documentation

## 2013-03-07 DIAGNOSIS — L0390 Cellulitis, unspecified: Secondary | ICD-10-CM | POA: Insufficient documentation

## 2013-03-07 LAB — POCT PREGNANCY, URINE: Preg Test, Ur: POSITIVE — AB

## 2013-03-07 NOTE — ED Notes (Signed)
Pt signed out AMA. Pt reports she had to go to work, will return later today.

## 2013-03-07 NOTE — ED Notes (Signed)
Abscess to R upper forearm, onset last Thurs.

## 2013-03-08 ENCOUNTER — Telehealth (HOSPITAL_COMMUNITY): Payer: Self-pay | Admitting: Emergency Medicine

## 2013-03-08 ENCOUNTER — Emergency Department (HOSPITAL_COMMUNITY)
Admission: EM | Admit: 2013-03-08 | Discharge: 2013-03-08 | Discharge status: Against Medical Advice | Payer: 59 | Attending: Emergency Medicine | Admitting: Emergency Medicine

## 2013-03-08 ENCOUNTER — Encounter (HOSPITAL_COMMUNITY): Payer: Self-pay | Admitting: *Deleted

## 2013-03-08 NOTE — ED Notes (Signed)
Pt unable to stay longer, Left without being seen

## 2013-03-08 NOTE — ED Notes (Signed)
Pt told today that she is pregnant. Has not had FU OB visit

## 2013-03-08 NOTE — ED Notes (Signed)
Faxed pregnancy letter to [facility] for patient to pickup.

## 2013-03-08 NOTE — ED Notes (Signed)
Pt co abscess on rt arm x1 week, started as small bump, spread, warm to the touch, red swollen area, oozes at times.

## 2013-05-09 LAB — OB RESULTS CONSOLE RUBELLA ANTIBODY, IGM: RUBELLA: IMMUNE

## 2013-05-09 LAB — OB RESULTS CONSOLE HEPATITIS B SURFACE ANTIGEN: Hepatitis B Surface Ag: NEGATIVE

## 2013-05-09 LAB — OB RESULTS CONSOLE RPR: RPR: NONREACTIVE

## 2013-05-09 LAB — OB RESULTS CONSOLE ABO/RH: RH TYPE: POSITIVE

## 2013-05-09 LAB — OB RESULTS CONSOLE ANTIBODY SCREEN: ANTIBODY SCREEN: NEGATIVE

## 2013-05-09 LAB — OB RESULTS CONSOLE HIV ANTIBODY (ROUTINE TESTING): HIV: NONREACTIVE

## 2013-05-17 ENCOUNTER — Other Ambulatory Visit: Payer: Self-pay

## 2013-05-17 LAB — OB RESULTS CONSOLE GC/CHLAMYDIA
Chlamydia: NEGATIVE
GC PROBE AMP, GENITAL: NEGATIVE

## 2013-07-06 ENCOUNTER — Ambulatory Visit (HOSPITAL_COMMUNITY): Admission: RE | Admit: 2013-07-06 | Payer: 59 | Source: Ambulatory Visit

## 2013-07-13 ENCOUNTER — Other Ambulatory Visit (HOSPITAL_COMMUNITY): Payer: Self-pay | Admitting: Obstetrics and Gynecology

## 2013-07-13 DIAGNOSIS — O35BXX1 Maternal care for other (suspected) fetal abnormality and damage, fetal cardiac anomalies, fetus 1: Secondary | ICD-10-CM

## 2013-07-13 DIAGNOSIS — O358XX1 Maternal care for other (suspected) fetal abnormality and damage, fetus 1: Secondary | ICD-10-CM

## 2013-07-19 ENCOUNTER — Ambulatory Visit (HOSPITAL_COMMUNITY): Payer: 59

## 2013-07-28 ENCOUNTER — Other Ambulatory Visit (HOSPITAL_COMMUNITY): Payer: Self-pay | Admitting: Obstetrics and Gynecology

## 2013-07-28 ENCOUNTER — Ambulatory Visit (HOSPITAL_COMMUNITY)
Admission: RE | Admit: 2013-07-28 | Discharge: 2013-07-28 | Disposition: A | Discharge status: Home / Self Care | Payer: 59 | Source: Ambulatory Visit | Attending: Obstetrics and Gynecology | Admitting: Obstetrics and Gynecology

## 2013-07-28 DIAGNOSIS — O35BXX1 Maternal care for other (suspected) fetal abnormality and damage, fetal cardiac anomalies, fetus 1: Secondary | ICD-10-CM

## 2013-07-28 DIAGNOSIS — O358XX1 Maternal care for other (suspected) fetal abnormality and damage, fetus 1: Secondary | ICD-10-CM

## 2013-07-28 DIAGNOSIS — E669 Obesity, unspecified: Secondary | ICD-10-CM | POA: Insufficient documentation

## 2013-07-28 DIAGNOSIS — Z3689 Encounter for other specified antenatal screening: Secondary | ICD-10-CM | POA: Insufficient documentation

## 2013-07-28 DIAGNOSIS — O358XX0 Maternal care for other (suspected) fetal abnormality and damage, not applicable or unspecified: Secondary | ICD-10-CM | POA: Insufficient documentation

## 2013-07-29 ENCOUNTER — Encounter (HOSPITAL_COMMUNITY): Payer: Self-pay

## 2013-10-06 NOTE — L&D Delivery Note (Signed)
Delivery Note At 2:08 AM a viable and healthy female was delivered via Vaginal, Spontaneous Delivery (Presentation: Left Occiput ; Anterior ).  APGAR: 9, 9; weight pending.   Placenta status: Intact, Spontaneous.  Cord: 3 vessels   Anesthesia: Epidural  Episiotomy: None Lacerations: None Suture Repair: none Est. Blood Loss (mL): 250  Mom to postpartum.  Baby to Couplet care / Skin to Skin.  Catherine Leonhard H. 10/13/2013, 2:24 AM

## 2013-10-12 ENCOUNTER — Encounter (HOSPITAL_COMMUNITY): Payer: Self-pay | Admitting: *Deleted

## 2013-10-12 ENCOUNTER — Inpatient Hospital Stay (HOSPITAL_COMMUNITY)
Admission: AD | Admit: 2013-10-12 | Discharge: 2013-10-14 | DRG: 775 | Disposition: A | Discharge status: Home / Self Care | Payer: 59 | Source: Ambulatory Visit | Attending: Obstetrics and Gynecology | Admitting: Obstetrics and Gynecology

## 2013-10-12 ENCOUNTER — Inpatient Hospital Stay (HOSPITAL_COMMUNITY): Payer: 59 | Admitting: Anesthesiology

## 2013-10-12 ENCOUNTER — Encounter (HOSPITAL_COMMUNITY): Payer: 59 | Admitting: Anesthesiology

## 2013-10-12 DIAGNOSIS — F1210 Cannabis abuse, uncomplicated: Secondary | ICD-10-CM | POA: Diagnosis present

## 2013-10-12 DIAGNOSIS — O99344 Other mental disorders complicating childbirth: Principal | ICD-10-CM | POA: Diagnosis present

## 2013-10-12 DIAGNOSIS — Z87891 Personal history of nicotine dependence: Secondary | ICD-10-CM

## 2013-10-12 LAB — CBC
HEMATOCRIT: 32.5 % — AB (ref 36.0–46.0)
HEMOGLOBIN: 11.1 g/dL — AB (ref 12.0–15.0)
MCH: 29.9 pg (ref 26.0–34.0)
MCHC: 34.2 g/dL (ref 30.0–36.0)
MCV: 87.6 fL (ref 78.0–100.0)
Platelets: 232 10*3/uL (ref 150–400)
RBC: 3.71 MIL/uL — ABNORMAL LOW (ref 3.87–5.11)
RDW: 13.7 % (ref 11.5–15.5)
WBC: 10.2 10*3/uL (ref 4.0–10.5)

## 2013-10-12 LAB — TYPE AND SCREEN
ABO/RH(D): O POS
Antibody Screen: NEGATIVE

## 2013-10-12 LAB — RPR: RPR Ser Ql: NONREACTIVE

## 2013-10-12 LAB — ABO/RH: ABO/RH(D): O POS

## 2013-10-12 MED ORDER — IBUPROFEN 600 MG PO TABS
600.0000 mg | ORAL_TABLET | Freq: Four times a day (QID) | ORAL | Status: DC | PRN
Start: 2013-10-12 — End: 2013-10-13
  Administered 2013-10-13: 600 mg via ORAL
  Filled 2013-10-12: qty 1

## 2013-10-12 MED ORDER — SODIUM BICARBONATE 8.4 % IV SOLN
INTRAVENOUS | Status: DC | PRN
  Administered 2013-10-12: 5 mL via EPIDURAL

## 2013-10-12 MED ORDER — LIDOCAINE HCL (PF) 1 % IJ SOLN
30.0000 mL | INTRAMUSCULAR | Status: DC | PRN
  Filled 2013-10-12 (×2): qty 30

## 2013-10-12 MED ORDER — DIPHENHYDRAMINE HCL 50 MG/ML IJ SOLN: 12.5000 mg | INTRAMUSCULAR | Status: DC | PRN

## 2013-10-12 MED ORDER — BUTORPHANOL TARTRATE 1 MG/ML IJ SOLN
1.0000 mg | INTRAMUSCULAR | Status: DC | PRN
  Administered 2013-10-12 (×2): 1 mg via INTRAVENOUS
  Filled 2013-10-12 (×2): qty 1

## 2013-10-12 MED ORDER — LACTATED RINGERS IV SOLN
500.0000 mL | INTRAVENOUS | Status: DC | PRN
Start: 2013-10-12 — End: 2013-10-13
  Administered 2013-10-12: 500 mL via INTRAVENOUS

## 2013-10-12 MED ORDER — OXYTOCIN BOLUS FROM INFUSION
500.0000 mL | INTRAVENOUS | Status: DC
  Administered 2013-10-13: 500 mL via INTRAVENOUS

## 2013-10-12 MED ORDER — CITRIC ACID-SODIUM CITRATE 334-500 MG/5ML PO SOLN: 30.0000 mL | ORAL | Status: DC | PRN

## 2013-10-12 MED ORDER — LACTATED RINGERS IV SOLN
INTRAVENOUS | Status: DC
  Administered 2013-10-12 – 2013-10-13 (×2): via INTRAVENOUS

## 2013-10-12 MED ORDER — EPHEDRINE 5 MG/ML INJ
10.0000 mg | INTRAVENOUS | Status: DC | PRN
  Filled 2013-10-12: qty 2

## 2013-10-12 MED ORDER — ONDANSETRON HCL 4 MG/2ML IJ SOLN
4.0000 mg | Freq: Four times a day (QID) | INTRAMUSCULAR | Status: DC | PRN
  Administered 2013-10-12: 4 mg via INTRAVENOUS
  Filled 2013-10-12: qty 2

## 2013-10-12 MED ORDER — LACTATED RINGERS IV SOLN: 500.0000 mL | Freq: Once | INTRAVENOUS | Status: DC

## 2013-10-12 MED ORDER — EPHEDRINE 5 MG/ML INJ
10.0000 mg | INTRAVENOUS | Status: DC | PRN
  Filled 2013-10-12: qty 4
  Filled 2013-10-12: qty 2

## 2013-10-12 MED ORDER — PENICILLIN G POTASSIUM 5000000 UNITS IJ SOLR
5.0000 10*6.[IU] | Freq: Once | INTRAVENOUS | Status: AC
  Administered 2013-10-12: 5 10*6.[IU] via INTRAVENOUS
  Filled 2013-10-12: qty 5

## 2013-10-12 MED ORDER — FLEET ENEMA 7-19 GM/118ML RE ENEM: 1.0000 | ENEMA | RECTAL | Status: DC | PRN

## 2013-10-12 MED ORDER — PENICILLIN G POTASSIUM 5000000 UNITS IJ SOLR
2.5000 10*6.[IU] | INTRAVENOUS | Status: DC
  Administered 2013-10-13: 2.5 10*6.[IU] via INTRAVENOUS
  Filled 2013-10-12 (×5): qty 2.5

## 2013-10-12 MED ORDER — ACETAMINOPHEN 325 MG PO TABS: 650.0000 mg | ORAL_TABLET | ORAL | Status: DC | PRN

## 2013-10-12 MED ORDER — FENTANYL 2.5 MCG/ML BUPIVACAINE 1/10 % EPIDURAL INFUSION (WH - ANES)
14.0000 mL/h | INTRAMUSCULAR | Status: DC | PRN
  Administered 2013-10-12 (×2): 14 mL/h via EPIDURAL
  Filled 2013-10-12: qty 125

## 2013-10-12 MED ORDER — PHENYLEPHRINE 40 MCG/ML (10ML) SYRINGE FOR IV PUSH (FOR BLOOD PRESSURE SUPPORT)
80.0000 ug | PREFILLED_SYRINGE | INTRAVENOUS | Status: DC | PRN
  Filled 2013-10-12: qty 2

## 2013-10-12 MED ORDER — PHENYLEPHRINE 40 MCG/ML (10ML) SYRINGE FOR IV PUSH (FOR BLOOD PRESSURE SUPPORT)
80.0000 ug | PREFILLED_SYRINGE | INTRAVENOUS | Status: DC | PRN
  Filled 2013-10-12: qty 2
  Filled 2013-10-12: qty 10

## 2013-10-12 MED ORDER — OXYTOCIN 40 UNITS IN LACTATED RINGERS INFUSION - SIMPLE MED
62.5000 mL/h | INTRAVENOUS | Status: DC
  Administered 2013-10-13: 62.5 mL/h via INTRAVENOUS
  Filled 2013-10-12: qty 1000

## 2013-10-12 MED ORDER — OXYCODONE-ACETAMINOPHEN 5-325 MG PO TABS: 1.0000 | ORAL_TABLET | ORAL | Status: DC | PRN

## 2013-10-12 NOTE — MAU Note (Addendum)
Patient reports to MAU with c/o lower abdominal pain and pinkish white discharge since yesterday. Denies LOF or contractions at this time. Reports good fetal movement.

## 2013-10-12 NOTE — Anesthesia Preprocedure Evaluation (Signed)

## 2013-10-12 NOTE — Anesthesia Procedure Notes (Signed)
Epidural Patient location during procedure: OB  Preanesthetic Checklist Completed: patient identified, site marked, surgical consent, pre-op evaluation, timeout performed, IV checked, risks and benefits discussed and monitors and equipment checked  Epidural Patient position: sitting Prep: site prepped and draped and DuraPrep Patient monitoring: continuous pulse ox and blood pressure Approach: midline Injection technique: LOR air  Needle:  Needle type: Tuohy  Needle gauge: 17 G Needle length: 9 cm and 9 Needle insertion depth: 8 cm Catheter type: closed end flexible Catheter size: 19 Gauge Catheter at skin depth: 15 cm Test dose: negative  Assessment Events: blood not aspirated, injection not painful, no injection resistance, negative IV test and no paresthesia  Additional Notes Dosing of Epidural:  1st dose, through catheter ............................................. epi 1:200K + Xylocaine 40 mg  2nd dose, through catheter, after waiting 3 minutes.....epi 1:200K + Xylocaine 60 mg    ( 2% Xylo charted as a single dose in Epic Meds for ease of charting; actual dosing was fractionated as above, for saftey's sake)  As each dose occurred, patient was free of IV sx; and patient exhibited no evidence of SA injection.  Patient is more comfortable after epidural dosed. Please see RN's note for documentation of vital signs,and FHR which are stable.  Patient reminded not to try to ambulate with numb legs, and that an RN must be present when she attempts to get up.       

## 2013-10-13 ENCOUNTER — Encounter (HOSPITAL_COMMUNITY): Payer: Self-pay | Admitting: *Deleted

## 2013-10-13 LAB — CBC
HCT: 30.6 % — ABNORMAL LOW (ref 36.0–46.0)
Hemoglobin: 10.7 g/dL — ABNORMAL LOW (ref 12.0–15.0)
MCH: 30.6 pg (ref 26.0–34.0)
MCHC: 35 g/dL (ref 30.0–36.0)
MCV: 87.4 fL (ref 78.0–100.0)
Platelets: 211 10*3/uL (ref 150–400)
RBC: 3.5 MIL/uL — ABNORMAL LOW (ref 3.87–5.11)
RDW: 13.6 % (ref 11.5–15.5)
WBC: 13.1 10*3/uL — ABNORMAL HIGH (ref 4.0–10.5)

## 2013-10-13 LAB — MRSA PCR SCREENING: MRSA BY PCR: NEGATIVE

## 2013-10-13 MED ORDER — DIPHENHYDRAMINE HCL 25 MG PO CAPS: 25.0000 mg | ORAL_CAPSULE | Freq: Four times a day (QID) | ORAL | Status: DC | PRN

## 2013-10-13 MED ORDER — ONDANSETRON HCL 4 MG PO TABS: 4.0000 mg | ORAL_TABLET | ORAL | Status: DC | PRN

## 2013-10-13 MED ORDER — DIBUCAINE 1 % RE OINT: 1.0000 "application " | TOPICAL_OINTMENT | RECTAL | Status: DC | PRN

## 2013-10-13 MED ORDER — OXYTOCIN 40 UNITS IN LACTATED RINGERS INFUSION - SIMPLE MED
1.0000 m[IU]/min | INTRAVENOUS | Status: DC
  Administered 2013-10-13: 2 m[IU]/min via INTRAVENOUS

## 2013-10-13 MED ORDER — LANOLIN HYDROUS EX OINT: TOPICAL_OINTMENT | CUTANEOUS | Status: DC | PRN

## 2013-10-13 MED ORDER — OXYCODONE-ACETAMINOPHEN 5-325 MG PO TABS
1.0000 | ORAL_TABLET | ORAL | Status: DC | PRN
Start: 2013-10-13 — End: 2013-10-14
  Administered 2013-10-14: 2 via ORAL
  Filled 2013-10-13: qty 2

## 2013-10-13 MED ORDER — METOCLOPRAMIDE HCL 10 MG PO TABS: 10.0000 mg | ORAL_TABLET | Freq: Once | ORAL | Status: DC

## 2013-10-13 MED ORDER — SENNOSIDES-DOCUSATE SODIUM 8.6-50 MG PO TABS
2.0000 | ORAL_TABLET | ORAL | Status: DC
  Administered 2013-10-13: 2 via ORAL
  Filled 2013-10-13: qty 2

## 2013-10-13 MED ORDER — BENZOCAINE-MENTHOL 20-0.5 % EX AERO
1.0000 "application " | INHALATION_SPRAY | CUTANEOUS | Status: DC | PRN
  Administered 2013-10-13: 1 via TOPICAL
  Filled 2013-10-13: qty 56

## 2013-10-13 MED ORDER — PRENATAL MULTIVITAMIN CH
1.0000 | ORAL_TABLET | Freq: Every day | ORAL | Status: DC
  Administered 2013-10-13: 1 via ORAL
  Filled 2013-10-13: qty 1

## 2013-10-13 MED ORDER — IBUPROFEN 600 MG PO TABS
600.0000 mg | ORAL_TABLET | Freq: Four times a day (QID) | ORAL | Status: DC
  Administered 2013-10-13 – 2013-10-14 (×4): 600 mg via ORAL
  Filled 2013-10-13 (×4): qty 1

## 2013-10-13 MED ORDER — LACTATED RINGERS IV SOLN: INTRAVENOUS | Status: DC

## 2013-10-13 MED ORDER — ZOLPIDEM TARTRATE 5 MG PO TABS: 5.0000 mg | ORAL_TABLET | Freq: Every evening | ORAL | Status: DC | PRN

## 2013-10-13 MED ORDER — ONDANSETRON HCL 4 MG/2ML IJ SOLN: 4.0000 mg | INTRAMUSCULAR | Status: DC | PRN

## 2013-10-13 MED ORDER — TERBUTALINE SULFATE 1 MG/ML IJ SOLN: 0.2500 mg | Freq: Once | INTRAMUSCULAR | Status: DC | PRN

## 2013-10-13 MED ORDER — TETANUS-DIPHTH-ACELL PERTUSSIS 5-2.5-18.5 LF-MCG/0.5 IM SUSP: 0.5000 mL | Freq: Once | INTRAMUSCULAR | Status: DC

## 2013-10-13 MED ORDER — WITCH HAZEL-GLYCERIN EX PADS: 1.0000 "application " | MEDICATED_PAD | CUTANEOUS | Status: DC | PRN

## 2013-10-13 MED ORDER — SIMETHICONE 80 MG PO CHEW: 80.0000 mg | CHEWABLE_TABLET | ORAL | Status: DC | PRN

## 2013-10-13 MED ORDER — FAMOTIDINE 20 MG PO TABS: 40.0000 mg | ORAL_TABLET | Freq: Once | ORAL | Status: DC

## 2013-10-13 NOTE — Progress Notes (Signed)
Clinical Social Work Department PSYCHOSOCIAL ASSESSMENT - MATERNAL/CHILD 10/13/2013  Patient:  Catherine, Davis  Account Number:  [id number]  Admit Date:  10/12/2013  Ardine Eng Name:   Catherine Davis    Clinical Social Worker:  Catherine Messing, LCSW   Date/Time:  10/13/2013 10:45 AM  Date Referred:  10/13/2013   Referral source  Physician     Referred reason  Substance Abuse   Other referral source:    I:  FAMILY / HOME ENVIRONMENT Child's legal guardian:  PARENT  Guardian - Name Guardian - Age Guardian - Address  [address], [place] [address]  Ferman Hamming     Other household support members/support persons Name Relationship DOB  65 yr old DAUGHTER   75 yr old SON    Other support:   [name] reports that her mother is supportive.    II  PSYCHOSOCIAL DATA Information Source:  Patient Interview  Occupational hygienist Employment:   [name] works at [employer] and will get 12 weeks maternity leave.   Financial resources:  Multimedia programmer If [name]:   Other  [place] / Grade:   Maternity Care Coordinator / Child Services Coordination / Early Interventions:   [name] reports adequate PNC.  Cultural issues impacting care:   None reported    III  STRENGTHS Strengths  Home prepared for Child (including basic supplies)  Supportive family/friends   Strength comment:  [name] reports all necessary supplies. [name] reports that FOB and her mother will assist as needed.   IV  RISK FACTORS AND CURRENT PROBLEMS Current Problem:  None   Risk Factor & Current Problem Patient Issue Family Issue Risk Factor / Current Problem Comment   N N     V  SOCIAL WORK ASSESSMENT CSW received referral due to [name] having hx of marijuana use. CSW reviewed chart and spoke with RN who reports [name] has been appropriate with baby. CSW met with [name] and baby at bedside. CSW introduced myself and explained role.    [name] reports that this baby is her 3rd child and that mom is  assisting with watching other children. [name] reports that she has all adequate supplies such as car seat, crib, clothes and diapers at home for baby. [name] plans to rely on FOB and mother for additional support at home.    [name] reports that she has been working at [employer] and is happy that she will get 12 weeks maternity leave. [name] has already been calling her insurance company, [employer], and getting information to complete FMLA paperwork. [name] feels with 12 weeks she will have adequate time to bond with baby before returning to work.    CSW and [name] discussed living arrangements and pregnancy. [name] reports safe environment and denies any substance use. Per chart review, [name] admits to [facility] use prior to pregnancy but reports no use since positive pregnancy test. [name] reports same information to CSW. CSW explained drug screen policy and [name] is aware of screen that will occur but reports she is positive that baby will not have a positive screen. [name] aware that CPS report is made if drug screen is positive.    [name] engaged during assessment and reports she is excited to get home to other children as well. [name] eating breakfast and reports that baby slept well last night so she is feeling better. [name] reports no needs at this time but thanked CSW for time.      VI SOCIAL WORK PLAN  Social Work Plan  Psychosocial Support/Ongoing Assessment of Needs   Type of pt/family education:   [name] aware of drug screen policy   If child protective services report - county:   If child protective services report - date:   Information/referral to community resources comment:   Other social work plan:   CSW will continue to follow until baby drug screen is completed. CPS report will be made if screen is positive.     Catherine Messing, LCSW (Coverage for [facility])

## 2013-10-13 NOTE — Progress Notes (Signed)
Patient is eating, ambulating, voiding.  Pain control is good.  Appropriate lochia.  No complaints.  Filed Vitals:   10/13/13 0316 10/13/13 0400 10/13/13 0454 10/13/13 0900  BP: 122/79 111/65 109/63 103/62  Pulse: 77 75 76 79  Temp:  98.1 F (36.7 C) 98 F (36.7 C) 97.8 F (36.6 C)  TempSrc:  Oral Oral Oral  Resp: 18 18 18 18   Height:      Weight:      SpO2:        Fundus firm No CT  Lab Results  Component Value Date   WBC 13.1* 10/13/2013   HGB 10.7* 10/13/2013   HCT 30.6* 10/13/2013   MCV 87.4 10/13/2013   PLT 211 10/13/2013    --/--/O POS, O POS (01/07 1926)  A/P Post partum day 0. Circ desired - consent obtained. Desires PPTL - unable to perform today, pt is happy to schedule as outpt.  Routine care.  Expect d/c 1/9.    Philip AspenALLAHAN, Catherine Davis

## 2013-10-13 NOTE — H&P (Addendum)
Catherine Davis is a 28 y.o. female presenting for contractions  28 yo [id number] @ 36+6 presents for contractions and was found to be in labor. Pt Tansferred care at 32 weeks. History OB History   Grav Para Term Preterm Abortions TAB SAB Ect Mult Living   6 2 2  3 2  1        History reviewed. No pertinent past medical history. Past Surgical History  Procedure Laterality Date  . Ectopic pregnancy surgery     Family History: family history includes Diabetes in her father and other. Social History:  reports that she has quit smoking. Her smoking use included Cigarettes. She smoked 1.00 pack per day. She has never used smokeless tobacco. She reports that she uses illicit drugs (Marijuana). She reports that she does not drink alcohol.   Prenatal Transfer Tool  Maternal Diabetes: No Genetic Screening: Normal Maternal Ultrasounds/Referrals: Normal Fetal Ultrasounds or other Referrals:  None Maternal Substance Abuse:  No Significant Maternal Medications:  None Significant Maternal Lab Results:  None Other Comments:  None  ROS: as above  Dilation: 10 Effacement (%): 100 Station: -2 Exam by:: Dr. Tenny Crawoss Blood pressure 104/65, pulse 86, temperature 98.3 F (36.8 C), temperature source Oral, resp. rate 18, height 5\' 10"  (1.778 m), weight 119.069 kg (262 lb 8 oz), last menstrual period 01/04/2013, SpO2 98.00%. Exam Physical Exam  Prenatal labs: ABO, Rh: --/--/O POS, O POS (01/07 1926) Antibody: NEG (01/07 1926) Rubella: Immune (08/04 0000) RPR: NON REACTIVE (01/07 1926)  HBsAg: Negative (08/04 0000)  HIV: Non-reactive (08/04 0000)  GBS:   unknown  Assessment/Plan: 1) Admit 2) Epidural 3) Anticipate SVD 4) Patient desires Tubal ligation, tubal papers signed 09/12/13, now 2432 days old. Will make NPO after delivery. Pt has a h/o an ectopic and is s/p a right salpingectomy   Lysander Calixte H. 10/13/2013, 2:26 AM

## 2013-10-13 NOTE — Anesthesia Postprocedure Evaluation (Signed)
  Anesthesia Post-op Note  Patient: Catherine Davis  Procedure(s) Performed: * No procedures listed *  Patient Location: PACU and Mother/Baby  Anesthesia Type:Epidural  Level of Consciousness: awake, alert  and oriented  Airway and Oxygen Therapy: Patient Spontanous Breathing  Post-op Pain: none  Post-op Assessment: Post-op Vital signs reviewed, Patient's Cardiovascular Status Stable, No headache, No backache, No residual numbness and No residual motor weakness  Post-op Vital Signs: Reviewed and stable  Complications: No apparent anesthesia complications

## 2013-10-13 NOTE — Progress Notes (Signed)
Spoke to Dr. Claiborne Billingsallahan and she went to see patient in patient's room and stated that tubal ligation will not be done today and patient is to follow up on an outpatient basis with MD.  RN will call and order patient breakfast tray. Mother baby RN- Emilio MathLanette Malena Timpone

## 2013-10-14 NOTE — Progress Notes (Signed)
Patient is eating, ambulating, voiding.  Pain control is good.  Filed Vitals:   10/13/13 0454 10/13/13 0900 10/13/13 1730 10/14/13 0557  BP: 109/63 103/62 115/80 115/74  Pulse: 76 79 86 77  Temp: 98 F (36.7 C) 97.8 F (36.6 C) 97.7 F (36.5 C) 97.2 F (36.2 C)  TempSrc: Oral Oral Oral Oral  Resp: 18 18 20 18   Height:      Weight:      SpO2:        Fundus firm Perineum without swelling.  Lab Results  Component Value Date   WBC 13.1* 10/13/2013   HGB 10.7* 10/13/2013   HCT 30.6* 10/13/2013   MCV 87.4 10/13/2013   PLT 211 10/13/2013    --/--/O POS, O POS (01/07 1926)/RI  A/P Post partum day 1.  Routine care.  Expect d/c routine.    Catherine Davis A

## 2013-10-14 NOTE — Discharge Summary (Signed)
Obstetric Discharge Summary Reason for Admission: onset of labor Prenatal Procedures: none Intrapartum Procedures: spontaneous vaginal delivery Postpartum Procedures: none Complications-Operative and Postpartum: none Hemoglobin  Date Value Range Status  10/13/2013 10.7* 12.0 - 15.0 g/dL Final     HCT  Date Value Range Status  10/13/2013 30.6* 36.0 - 46.0 % Final    Discharge Diagnoses: Term Pregnancy-delivered  Discharge Information: Date: 10/14/2013 Activity: pelvic rest Diet: routine Medications: Ibuprofen Condition: stable Instructions: refer to practice specific booklet Discharge to: home Follow-up Information   Follow up with Almon HerculesOSS,KENDRA H., MD In 4 weeks.   Specialty:  Obstetrics and Gynecology   Contact information:   [address] [place] [address] [phone]       Newborn Data: Live born female  Birth Weight: 7 lb 2.1 oz (3235 g) APGAR: 9, 9  Home with mother.  Catherine Davis A 10/14/2013, 7:52 AM

## 2014-04-04 ENCOUNTER — Emergency Department (HOSPITAL_COMMUNITY): Payer: 59

## 2014-04-04 ENCOUNTER — Encounter (HOSPITAL_COMMUNITY): Payer: Self-pay | Admitting: Emergency Medicine

## 2014-04-04 ENCOUNTER — Emergency Department (HOSPITAL_COMMUNITY)
Admission: EM | Admit: 2014-04-04 | Discharge: 2014-04-04 | Disposition: A | Discharge status: Home / Self Care | Payer: 59 | Attending: Emergency Medicine | Admitting: Emergency Medicine

## 2014-04-04 DIAGNOSIS — J159 Unspecified bacterial pneumonia: Secondary | ICD-10-CM | POA: Insufficient documentation

## 2014-04-04 DIAGNOSIS — Z79899 Other long term (current) drug therapy: Secondary | ICD-10-CM | POA: Insufficient documentation

## 2014-04-04 DIAGNOSIS — Z3490 Encounter for supervision of normal pregnancy, unspecified, unspecified trimester: Secondary | ICD-10-CM

## 2014-04-04 DIAGNOSIS — O9989 Other specified diseases and conditions complicating pregnancy, childbirth and the puerperium: Secondary | ICD-10-CM | POA: Insufficient documentation

## 2014-04-04 DIAGNOSIS — R079 Chest pain, unspecified: Secondary | ICD-10-CM | POA: Insufficient documentation

## 2014-04-04 DIAGNOSIS — Z87891 Personal history of nicotine dependence: Secondary | ICD-10-CM | POA: Insufficient documentation

## 2014-04-04 DIAGNOSIS — J189 Pneumonia, unspecified organism: Secondary | ICD-10-CM

## 2014-04-04 LAB — CBC WITH DIFFERENTIAL/PLATELET
Basophils Absolute: 0 10*3/uL (ref 0.0–0.1)
Basophils Relative: 0 % (ref 0–1)
Eosinophils Absolute: 0.1 10*3/uL (ref 0.0–0.7)
Eosinophils Relative: 2 % (ref 0–5)
HCT: 32.6 % — ABNORMAL LOW (ref 36.0–46.0)
Hemoglobin: 11 g/dL — ABNORMAL LOW (ref 12.0–15.0)
Lymphocytes Relative: 10 % — ABNORMAL LOW (ref 12–46)
Lymphs Abs: 0.7 10*3/uL (ref 0.7–4.0)
MCH: 29.1 pg (ref 26.0–34.0)
MCHC: 33.7 g/dL (ref 30.0–36.0)
MCV: 86.2 fL (ref 78.0–100.0)
Monocytes Absolute: 0.4 10*3/uL (ref 0.1–1.0)
Monocytes Relative: 5 % (ref 3–12)
Neutro Abs: 5.9 10*3/uL (ref 1.7–7.7)
Neutrophils Relative %: 83 % — ABNORMAL HIGH (ref 43–77)
Platelets: 242 10*3/uL (ref 150–400)
RBC: 3.78 MIL/uL — ABNORMAL LOW (ref 3.87–5.11)
RDW: 13.2 % (ref 11.5–15.5)
WBC: 7.2 10*3/uL (ref 4.0–10.5)

## 2014-04-04 LAB — BASIC METABOLIC PANEL
BUN: 10 mg/dL (ref 6–23)
CO2: 22 mEq/L (ref 19–32)
Calcium: 8.7 mg/dL (ref 8.4–10.5)
Chloride: 98 mEq/L (ref 96–112)
Creatinine, Ser: 0.66 mg/dL (ref 0.50–1.10)
GFR calc Af Amer: >90 mL/min (ref >90)
GFR calc non Af Amer: >90 mL/min (ref >90)
Glucose, Bld: 125 mg/dL — ABNORMAL HIGH (ref 70–99)
Potassium: 3.5 mEq/L — ABNORMAL LOW (ref 3.7–5.3)
Sodium: 134 mEq/L — ABNORMAL LOW (ref 137–147)

## 2014-04-04 LAB — PREGNANCY, URINE: Preg Test, Ur: POSITIVE — AB

## 2014-04-04 LAB — TROPONIN I: Troponin I: <0.3 ng/mL (ref <0.30)

## 2014-04-04 MED ORDER — ONDANSETRON HCL 4 MG/2ML IJ SOLN
4.0000 mg | Freq: Once | INTRAMUSCULAR | Status: AC
  Administered 2014-04-04: 4 mg via INTRAVENOUS

## 2014-04-04 MED ORDER — HYDROMORPHONE HCL PF 1 MG/ML IJ SOLN
1.0000 mg | Freq: Once | INTRAMUSCULAR | Status: AC
  Administered 2014-04-04: 1 mg via INTRAVENOUS
  Filled 2014-04-04: qty 1

## 2014-04-04 MED ORDER — AZITHROMYCIN 250 MG PO TABS: ORAL_TABLET | ORAL | Status: DC

## 2014-04-04 MED ORDER — SODIUM CHLORIDE 0.9 % IV BOLUS (SEPSIS)
1000.0000 mL | Freq: Once | INTRAVENOUS | Status: AC
  Administered 2014-04-04: 1000 mL via INTRAVENOUS

## 2014-04-04 MED ORDER — OXYCODONE-ACETAMINOPHEN 5-325 MG PO TABS: 1.0000 | ORAL_TABLET | ORAL | Status: DC | PRN

## 2014-04-04 MED ORDER — IOHEXOL 350 MG/ML SOLN
100.0000 mL | Freq: Once | INTRAVENOUS | Status: AC | PRN
  Administered 2014-04-04: 100 mL via INTRAVENOUS

## 2014-04-04 MED ORDER — DEXTROSE 5 % IV SOLN
1.0000 g | Freq: Once | INTRAVENOUS | Status: AC
  Administered 2014-04-04: 1 g via INTRAVENOUS
  Filled 2014-04-04: qty 10

## 2014-04-04 MED ORDER — ONDANSETRON HCL 4 MG/2ML IJ SOLN
4.0000 mg | Freq: Once | INTRAMUSCULAR | Status: DC
  Filled 2014-04-04: qty 2

## 2014-04-04 MED ORDER — AZITHROMYCIN 250 MG PO TABS
500.0000 mg | ORAL_TABLET | Freq: Once | ORAL | Status: AC
  Administered 2014-04-04: 500 mg via ORAL
  Filled 2014-04-04: qty 2

## 2014-04-04 MED ORDER — OXYCODONE-ACETAMINOPHEN 5-325 MG PO TABS
1.0000 | ORAL_TABLET | Freq: Once | ORAL | Status: AC
  Administered 2014-04-04: 1 via ORAL
  Filled 2014-04-04: qty 1

## 2014-04-04 MED ORDER — PRENATAL COMPLETE 14-0.4 MG PO TABS: 1.0000 | ORAL_TABLET | Freq: Every day | ORAL | Status: DC

## 2014-04-04 NOTE — ED Notes (Signed)
Pt states she is pregnant, positive test at home

## 2014-04-04 NOTE — ED Provider Notes (Signed)
Pt noted to have pneumonia She is stable No PE noted She is pregnant (approximately 5 weeks by LMP) denies abd pain/cramping/bleeding Will give rocephin/azithromycin She requests pain meds.  She understands risks of passage to fetus.     Catherine Davis W Wickline, MD 04/04/14 [phone]

## 2014-04-04 NOTE — ED Provider Notes (Signed)
CSN: [id number]     Arrival date & time 04/04/14  0505 History   First MD Initiated Contact with Patient 04/04/14 [phone]     Chief Complaint  Patient presents with  . Chest Pain  . Shortness of Breath     (Consider location/radiation/quality/duration/timing/severity/associated sxs/prior Treatment) HPI  28 year old female with left-sided chest pain. Feels deep to L breast. Gradual onset on Sunday and progressively worsening. The pain as sharp. Doesn't radiate. Pain is worse with lying flat and deep inspiration. Feels short of breath. No cough. Doesn't necessarily feel feverish, but sweating a lot. No chills. No unusual leg pain or swelling. Patient is approximately 5 months postpartum. She is again pregnant per a home pregnancy test. Last menstrual period was May 26. No unusual leg pain or swelling. No recent prolonged immobilization or surgeries. No history of DVT/PE. Has never had pain like this before. Denies cocaine usage.   History reviewed. No pertinent past medical history. Past Surgical History  Procedure Laterality Date  . Ectopic pregnancy surgery     Family History  Problem Relation Age of Onset  . Diabetes Father   . Diabetes Other    History  Substance Use Topics  . Smoking status: Former Smoker -- 1.00 packs/day    Types: Cigarettes  . Smokeless tobacco: Never Used  . Alcohol Use: No   OB History   Grav Para Term Preterm Abortions TAB SAB Ect Mult Living   6 3 3  3 2  1  1      Review of Systems  All systems reviewed and negative, other than as noted in HPI.   Allergies  Review of patient's allergies indicates no known allergies.  Home Medications   Prior to Admission medications   Medication Sig Start Date End Date Taking? Authorizing Provider  Prenatal Vit-Fe Fumarate-FA (PRENATAL MULTIVITAMIN) TABS tablet Take 1 tablet by mouth daily.   Yes Historical Provider, MD   BP 124/86  Temp(Src) 98.1 F (36.7 C) (Oral)  Resp 22  Ht 5\' 10"  (1.778 m)  Wt 235  lb (106.595 kg)  BMI 33.72 kg/m2  SpO2 98%  LMP 02/28/2014 Physical Exam  Nursing note and vitals reviewed. Constitutional: She appears well-developed and well-nourished.  Laying in bed. Appears uncomfortable, but not toxic. Obese.   HENT:  Head: Normocephalic and atraumatic.  Eyes: Conjunctivae are normal. Right eye exhibits no discharge. Left eye exhibits no discharge.  Neck: Neck supple.  Cardiovascular: Normal rate, regular rhythm and normal heart sounds.  Exam reveals no gallop and no friction rub.   No murmur heard. Pulmonary/Chest: No respiratory distress. She exhibits no tenderness.  Rapid, shallow breathing. No adventitious sounds appreciated. Sounds symmetric. CP not reproducible with palpation.   Abdominal: Soft. She exhibits no distension. There is no tenderness.  Musculoskeletal: She exhibits no edema and no tenderness.  Lower extremities symmetric as compared to each other. No calf tenderness. Negative Homan's. No palpable cords.   Neurological: She is alert.  Skin: Skin is warm and dry.  Psychiatric: She has a normal mood and affect. Her behavior is normal. Thought content normal.    ED Course  Procedures (including critical care time) Labs Review Labs Reviewed  CBC WITH DIFFERENTIAL - Abnormal; Notable for the following:    RBC 3.78 (*)    Hemoglobin 11.0 (*)    HCT 32.6 (*)    Neutrophils Relative % 83 (*)    Lymphocytes Relative 10 (*)    All other components within normal limits  BASIC  METABOLIC PANEL - Abnormal; Notable for the following:    Sodium 134 (*)    Potassium 3.5 (*)    Glucose, Bld 125 (*)    All other components within normal limits  TROPONIN I  PREGNANCY, URINE    Imaging Review Dg Chest 2 View  04/04/2014   CLINICAL DATA:  Pregnancy, shortness of breath.  EXAM: CHEST  2 VIEW  COMPARISON:  None.  FINDINGS: Cardiomediastinal silhouette is unremarkable for this low inspiratory examination with crowded vasculature markings. The lungs are  clear without pleural effusions or focal consolidations. Trachea projects midline and there is no pneumothorax. Included soft tissue planes and osseous structures are non-suspicious.  IMPRESSION: No acute cardiopulmonary process.   Electronically Signed   By: Awilda Metroourtnay  Bloomer   On: 04/04/2014 06:25     EKG Interpretation None      EKG:  Rhythm: normal sinus Rate: 95 Axis: normal PR: 141 ms QRS: 89 ms QTc: 409 ms ST segments: NS ST changes No old    MDM   Final diagnoses:  Chest pain, unspecified chest pain type    28 year old female with left-sided chest pain. Seems pretty uncomfortable. Hard to get a good lung exam because patient is splinting so much. Her lungs sound clear to me though. Consider pneumothorax, particularly with her smoking history but lung sounds symmetric. Consider pulmonary embolism. Consider pericarditis. Consider infectious etiology. Very atypical for ACS. Denies trauma. Denies drug usage.  Her abdominal exam is benign. Plan EKG, labs and CXR. Plan CT if CXR doesn't give good explanation. D-dimer will most likely be elevated with pregnancy.   6:34 AM Discussed findings at this time. More comfortable, but remains tachypneic around 26-28. Explained my concern for possible PE and radiation risks of CT in pregnancy. Pt understands and will proceed with CT angio chest.     Raeford RazorStephen Aliany Fiorenza, MD 04/05/14 1227

## 2014-04-04 NOTE — ED Notes (Signed)
Patient placed on continuous cardiac monitoring, continuous pulse 0x monitoring 

## 2014-04-04 NOTE — ED Notes (Signed)
Patient ambulated in hall.  Patient's o2 sat remained at 97-99%.

## 2014-04-04 NOTE — ED Notes (Signed)
Pt states new onset sudden sharp pain to left side on Sunday, in rib area, denies injury, associated with SOB

## 2014-04-06 ENCOUNTER — Ambulatory Visit (INDEPENDENT_AMBULATORY_CARE_PROVIDER_SITE_OTHER): Payer: BC Managed Care – PPO | Admitting: Family

## 2014-04-06 ENCOUNTER — Encounter: Payer: Self-pay | Admitting: Family

## 2014-04-06 VITALS — BP 116/82 | HR 90 | Temp 99.5°F | Ht 70.0 in | Wt 262.2 lb

## 2014-04-06 DIAGNOSIS — Z09 Encounter for follow-up examination after completed treatment for conditions other than malignant neoplasm: Secondary | ICD-10-CM

## 2014-04-06 DIAGNOSIS — J90 Pleural effusion, not elsewhere classified: Secondary | ICD-10-CM

## 2014-04-06 DIAGNOSIS — J189 Pneumonia, unspecified organism: Secondary | ICD-10-CM

## 2014-04-06 MED ORDER — ALBUTEROL SULFATE HFA 108 (90 BASE) MCG/ACT IN AERS: 2.0000 | INHALATION_SPRAY | Freq: Four times a day (QID) | RESPIRATORY_TRACT | Status: DC | PRN

## 2014-04-06 NOTE — Patient Instructions (Signed)
Pleural Effusion The lining covering your lungs and the inside of your chest is called the pleura. Usually, the space between the 2 pleura contains no air and only a thin layer of fluid. A pleural effusion is an abnormal buildup of fluid in the pleural space. Fluid gathers when there is increased pressure in the lung vessels. This forces fluids out of the lungs and into the pleural space. Vessels may also leak fluids when there are infections, such as pneumonia, or other causes of soreness and redness (inflammation). Fluids leak into the lungs when protein in the blood is low or when certain vessels (lymphatics) are blocked. Finding a pleural effusion is important because it is usually caused by another disease. In order to treat a pleural effusion, your health care provider needs to find its cause. If left untreated, a large amount of fluid can build up and cause collapse of the lung. CAUSES   Heart failure.  Infections (pneumonia, tuberculosis), pulmonary embolism, pulmonary infarction.  Cancer (primary lung and metastatic), asbestosis.  Liver failure (cirrhosis).  Nephrotic syndrome, peritoneal dialysis, kidney problems (uremia).  Collagen vascular disease (systemic lupus erythematosis, rheumatoid arthritis).  Injury (trauma) to the chest or rupture of the digestive tube (esophagus).  Material in the chest or pleural space (hemothorax, chylothorax).  Pancreatitis.  Surgery.  Drug reactions. SYMPTOMS  A pleural effusion can decrease the amount of space available for breathing and make you short of breath. The fluid can become infected, which may cause pain and fever. Often, the pain is worse when taking a deep breath. The underlying disease (heart failure, pneumonia, blood clot, tuberculosis, cancer) may also cause symptoms. DIAGNOSIS   Your health care provider can usually tell what is wrong by talking to you (taking a history), doing an exam, and taking a routine X-ray. If the  X-ray shows fluid in your chest, often fluid is removed from your chest with a needle for testing (diagnostic thoracentesis).  Sometimes, more specialized X-rays may be needed.  Sometimes, a small piece of tissue is removed and examined by a specialist (biopsy). TREATMENT  Treatment varies based on what caused the pleural effusion. Treatments include:  Removing as much fluid as possible using a needle (thoracentesis) to improve the cough and shortness of breath. This is a simple procedure which can be done at bedside. The risks are bleeding, infection, collapse of a lung, or low blood pressure.  Placing a tube in the chest to drain the effusion (tube thoracostomy). This is often used when there is an infection in the fluid. This is a simple procedure which can often be done at bedside or in a clinic. The procedure may be painful. The risks are the same as using a needle to drain the fluid. The chest tube usually remains for a few days and is connected to suction to improve fluid drainage. The tube, after placement, usually does not cause much discomfort.  Surgical removal of fibrous debris in and around the pleural space (decortication). This may be done with a flexible telescope (thoracoscope) through a small or large cut (incision). This is helpful for patients who have fibrosis or scar tissue that prevents complete lung expansion. The risks are infection, blood loss, and side effects from general anesthesia.  Sometimes, a procedure called pleurodesis is done. A chest tube is placed and the fluid is drained. Next, an agent (tetracycline, talc powder) is added to the pleural space. This causes the lung and chest Savarino to stick together (adhesion). This leaves no  potential space for fluid to build up. The risks include infection, blood loss, and side effects from general anesthesia.  If the effusion is caused by infection, it may be treated with antibiotics and improve without draining. HOME CARE  INSTRUCTIONS   Take any medicines exactly as prescribed.  Follow up with your health care provider as directed.  Monitor your exercise capacity (the amount of walking you can do before you get short of breath).  Do not use any tobacco products including cigaretts, chewing tobacco, or electronic cigarettes. SEEK MEDICAL CARE IF:   Your exercise capacity seems to get worse or does not improve with time.  You do not recover from your illness.  You have drainage, redness, swelling, or pain at any incision or puncture sites. SEEK IMMEDIATE MEDICAL CARE IF:   Shortness of breath or chest pain develops or gets worse.  You have a fever.  You develop a new cough, especially if the mucus (phlegm) is discolored. MAKE SURE YOU:   Understand these instructions.  Will watch your condition.  Will get help right away if you are not doing well or get worse. Document Released: 09/22/2005 Document Revised: 09/27/2013 Document Reviewed: 05/14/2007 [facility] Patient Information 2015 [place], [place]. This information is not intended to replace advice given to you by your health care provider. Make sure you discuss any questions you have with your health care provider. Pneumonia, Adult Pneumonia is an infection of the lungs.  CAUSES Pneumonia may be caused by bacteria or a virus. Usually, these infections are caused by breathing infectious particles into the lungs (respiratory tract). SYMPTOMS   Cough.  Fever.  Chest pain.  Increased rate of breathing.  Wheezing.  Mucus production. DIAGNOSIS  If you have the common symptoms of pneumonia, your caregiver will typically confirm the diagnosis with a chest X-ray. The X-ray will show an abnormality in the lung (pulmonary infiltrate) if you have pneumonia. Other tests of your blood, urine, or sputum may be done to find the specific cause of your pneumonia. Your caregiver may also do tests (blood gases or pulse oximetry) to see how well your lungs  are working. TREATMENT  Some forms of pneumonia may be spread to other people when you cough or sneeze. You may be asked to wear a mask before and during your exam. Pneumonia that is caused by bacteria is treated with antibiotic medicine. Pneumonia that is caused by the influenza virus may be treated with an antiviral medicine. Most other viral infections must run their course. These infections will not respond to antibiotics.  PREVENTION A pneumococcal shot (vaccine) is available to prevent a common bacterial cause of pneumonia. This is usually suggested for:  People over 27 years old.  Patients on chemotherapy.  People with chronic lung problems, such as bronchitis or emphysema.  People with immune system problems. If you are over 65 or have a high risk condition, you may receive the pneumococcal vaccine if you have not received it before. In some countries, a routine influenza vaccine is also recommended. This vaccine can help prevent some cases of pneumonia.You may be offered the influenza vaccine as part of your care. If you smoke, it is time to quit. You may receive instructions on how to stop smoking. Your caregiver can provide medicines and counseling to help you quit. HOME CARE INSTRUCTIONS   Cough suppressants may be used if you are losing too much rest. However, coughing protects you by clearing your lungs. You should avoid using cough suppressants if  you can.  Your caregiver may have prescribed medicine if he or she thinks your pneumonia is caused by a bacteria or influenza. Finish your medicine even if you start to feel better.  Your caregiver may also prescribe an expectorant. This loosens the mucus to be coughed up.  Only take over-the-counter or prescription medicines for pain, discomfort, or fever as directed by your caregiver.  Do not smoke. Smoking is a common cause of bronchitis and can contribute to pneumonia. If you are a smoker and continue to smoke, your cough may  last several weeks after your pneumonia has cleared.  A cold steam vaporizer or humidifier in your room or home may help loosen mucus.  Coughing is often worse at night. Sleeping in a semi-upright position in a recliner or using a couple pillows under your head will help with this.  Get rest as you feel it is needed. Your body will usually let you know when you need to rest. SEEK IMMEDIATE MEDICAL CARE IF:   Your illness becomes worse. This is especially true if you are elderly or weakened from any other disease.  You cannot control your cough with suppressants and are losing sleep.  You begin coughing up blood.  You develop pain which is getting worse or is uncontrolled with medicines.  You have a fever.  Any of the symptoms which initially brought you in for treatment are getting worse rather than better.  You develop shortness of breath or chest pain. MAKE SURE YOU:   Understand these instructions.  Will watch your condition.  Will get help right away if you are not doing well or get worse. Document Released: 09/22/2005 Document Revised: 12/15/2011 Document Reviewed: 12/12/2010 [facility] Patient Information 2015 [place], [place]. This information is not intended to replace advice given to you by your health care provider. Make sure you discuss any questions you have with your health care provider.

## 2014-04-06 NOTE — Progress Notes (Signed)
   Subjective:    Patient ID: Catherine MalletAshlee M Mentor, female    DOB: [birth year], 28 y.o.   MRN: [id number]  HPI Pt presents to the office for follow-up from pneumonia. Pt was seen in the ED Monday night. While she was there pt received rocephin 1 g, NS bolus, and pain medications. Pt was prescribed azithromycin 500 mg for 4 days. Pt states she is still having SOB and pain under her left breast. Pt states the pain is a constant sharp 4 out 10.   Pt had CT of chest done in ED that showed- LLL pneumonia and a small left pleural effusion. Pt is also pregnant, but states she is not keeping it.   Review of Systems  HENT: Negative.   Eyes: Negative.   Cardiovascular: Negative.   Gastrointestinal: Negative.   Endocrine: Negative.   Genitourinary: Negative.   Musculoskeletal: Negative.   Neurological: Negative.   Hematological: Negative.   Psychiatric/Behavioral: Negative.   All other systems reviewed and are negative.      Objective:   Physical Exam  Vitals reviewed. Constitutional: She is oriented to person, place, and time. She appears well-developed and well-nourished. No distress.  HENT:  Head: Normocephalic and atraumatic.  Right Ear: External ear normal.  Mouth/Throat: Oropharynx is clear and moist.  Eyes: Pupils are equal, round, and reactive to light.  Neck: Normal range of motion. Neck supple. No thyromegaly present.  Cardiovascular: Normal rate, regular rhythm, normal heart sounds and intact distal pulses.   No murmur heard. Pulmonary/Chest: Effort normal. No respiratory distress. She has no wheezes.  Diminished breath sounds   Abdominal: Soft. Bowel sounds are normal. She exhibits no distension. There is no tenderness.  Musculoskeletal: Normal range of motion. She exhibits no edema and no tenderness.  Neurological: She is alert and oriented to person, place, and time.  Skin: Skin is warm and dry.  Psychiatric: She has a normal mood and affect. Her behavior is normal. Judgment and  thought content normal.    BP 116/82  Pulse 90  Temp(Src) 99.5 F (37.5 C) (Oral)  Ht 5\' 10"  (1.778 m)  Wt 262 lb 3.2 oz (118.933 kg)  BMI 37.62 kg/m2  LMP 02/28/2014       Assessment & Plan:  1. Pneumonia involving left lung, unspecified part of lung  2. Pleural effusion  3. Hospital discharge follow-up  -Continue all current medications -Force fluids -Rest -RTO in 2 weeks -Pt has FMLA paperwork she wants filled out Meds ordered this encounter  Medications  . albuterol (PROVENTIL HFA;VENTOLIN HFA) 108 (90 BASE) MCG/ACT inhaler    Sig: Inhale 2 puffs into the lungs every 6 (six) hours as needed for wheezing or shortness of breath.    Dispense:  1 Inhaler    Refill:  2    Order Specific Question:  Supervising Provider    Answer:  Ernestina PennaMOORE, DONALD W [[id number]]   Jannifer Rodneyhristy Hawks, FNP

## 2014-04-10 ENCOUNTER — Telehealth: Payer: Self-pay | Admitting: Family

## 2014-04-10 ENCOUNTER — Encounter: Payer: Self-pay | Admitting: *Deleted

## 2014-04-10 NOTE — Telephone Encounter (Signed)
Done & faxed by front office

## 2014-04-10 NOTE — Telephone Encounter (Signed)
Spoke with Pt and states breathing is much better and would like to return to work today. Note written

## 2014-04-26 ENCOUNTER — Ambulatory Visit: Payer: BC Managed Care – PPO | Admitting: Family

## 2014-07-21 ENCOUNTER — Other Ambulatory Visit: Payer: Self-pay

## 2014-08-07 ENCOUNTER — Encounter: Payer: Self-pay | Admitting: Family

## 2015-11-13 ENCOUNTER — Ambulatory Visit (INDEPENDENT_AMBULATORY_CARE_PROVIDER_SITE_OTHER): Payer: BLUE CROSS/BLUE SHIELD | Admitting: Family Medicine

## 2015-11-13 ENCOUNTER — Ambulatory Visit: Payer: Self-pay

## 2015-11-13 VITALS — BP 103/60 | HR 96 | Temp 101.4°F | Ht 70.0 in | Wt 257.4 lb

## 2015-11-13 DIAGNOSIS — R05 Cough: Secondary | ICD-10-CM

## 2015-11-13 DIAGNOSIS — J101 Influenza due to other identified influenza virus with other respiratory manifestations: Secondary | ICD-10-CM

## 2015-11-13 DIAGNOSIS — R059 Cough, unspecified: Secondary | ICD-10-CM

## 2015-11-13 DIAGNOSIS — R509 Fever, unspecified: Secondary | ICD-10-CM | POA: Diagnosis not present

## 2015-11-13 LAB — POCT RAPID STREP A (OFFICE): Rapid Strep A Screen: NEGATIVE

## 2015-11-13 LAB — POCT INFLUENZA A/B
INFLUENZA A, POC: NEGATIVE
INFLUENZA B, POC: NEGATIVE

## 2015-11-13 MED ORDER — OSELTAMIVIR PHOSPHATE 75 MG PO CAPS: 75.0000 mg | ORAL_CAPSULE | Freq: Two times a day (BID) | ORAL | Status: DC

## 2015-11-13 MED ORDER — HYDROCODONE-HOMATROPINE 5-1.5 MG/5ML PO SYRP: 5.0000 mL | ORAL_SOLUTION | Freq: Four times a day (QID) | ORAL | Status: DC | PRN

## 2015-11-13 NOTE — Addendum Note (Signed)
Addended by: Mechele Claude on: 11/13/2015 05:55 PM   Modules accepted: Kipp Brood

## 2015-11-13 NOTE — Progress Notes (Addendum)
Subjective:  Patient ID: Catherine Davis, female    DOB: Dec 18, 1985  Age: 30 y.o. MRN: [id number]  CC: URI   HPI Catherine Davis presents with profuse dry cough, runny stuffy nose. Diffuse headache of moderate intensity. Patient also has chills and moderate fever. Body aches worst in the back but present in the legs, shoulders, and torso as well. Has sapped the energy to the point that of being unable to perform usual activities Onset 2 days ago.    History Catherine Davis has no past medical history on file.   She has past surgical history that includes Ectopic pregnancy surgery.   Her family history includes Diabetes in her father and other.She reports that she has quit smoking. Her smoking use included Cigarettes. She smoked 1.00 pack per day. She has never used smokeless tobacco. She reports that she uses illicit drugs (Marijuana). She reports that she does not drink alcohol.    ROS Review of Systems  Constitutional: Positive for fever, chills and appetite change (Decreased).  HENT: Positive for congestion and rhinorrhea (Clear). Negative for ear pain, nosebleeds, postnasal drip, sinus pressure and sore throat.   Respiratory: Negative for chest tightness and shortness of breath.   Cardiovascular: Negative for chest pain.  Musculoskeletal: Positive for myalgias.  Skin: Negative for rash.    Objective:  BP 103/60 mmHg  Pulse 96  Temp(Src) 101.4 F (38.6 C) (Oral)  Ht  (1.778 m)  Wt 257 lb 6.4 oz (116.756 kg)  BMI 36.93 kg/m2  SpO2 98%  LMP 10/19/2015  BP Readings from Last 3 Encounters:  11/13/15 103/60  04/06/14 116/82  04/04/14 110/68    Wt Readings from Last 3 Encounters:  11/13/15 257 lb 6.4 oz (116.756 kg)  04/06/14 262 lb 3.2 oz (118.933 kg)  04/04/14 235 lb (106.595 kg)     Physical Exam  Constitutional: She is oriented to person, place, and time. She appears well-developed and well-nourished. No distress.  HENT:  Head: Normocephalic and atraumatic.    Eyes: Conjunctivae are normal. Pupils are equal, round, and reactive to light.  Neck: Normal range of motion. Neck supple. No thyromegaly present.  Cardiovascular: Normal rate, regular rhythm and normal heart sounds.   No murmur heard. Pulmonary/Chest: Effort normal and breath sounds normal. No respiratory distress. She has no wheezes. She has no rales.  Abdominal: Soft. Bowel sounds are normal. She exhibits no distension. There is no tenderness.  Musculoskeletal: Normal range of motion.  Lymphadenopathy:    She has no cervical adenopathy.  Neurological: She is alert and oriented to person, place, and time.  Skin: Skin is warm and dry.  Psychiatric: She has a normal mood and affect. Her behavior is normal. Judgment and thought content normal.       Results for orders placed or performed in visit on 11/13/15  POCT Influenza A/B  Result Value Ref Range   Influenza A, POC Negative Negative   Influenza B, POC Negative Negative  POCT rapid strep A  Result Value Ref Range   Rapid Strep A Screen Negative Negative     Assessment & Plan:   Catherine Davis was seen today for uri.  Diagnoses and all orders for this visit:  Influenza A  Cough -     POCT Influenza A/B -     POCT rapid strep A  Fever, unspecified -     POCT Influenza A/B -     POCT rapid strep A  Other orders -  oseltamivir (TAMIFLU) 75 MG capsule; Take 1 capsule (75 mg total) by mouth 2 (two) times daily. -     HYDROcodone-homatropine (HYCODAN) 5-1.5 MG/5ML syrup; Take 5 mLs by mouth every 6 (six) hours as needed for cough.     I have discontinued Catherine Davis's PRENATAL COMPLETE, oxyCODONE-acetaminophen, and azithromycin. I am also having her start on oseltamivir and HYDROcodone-homatropine. Additionally, I am having her maintain her albuterol.  Meds ordered this encounter  Medications  . oseltamivir (TAMIFLU) 75 MG capsule    Sig: Take 1 capsule (75 mg total) by mouth 2 (two) times daily.    Dispense:  10  capsule    Refill:  0  . HYDROcodone-homatropine (HYCODAN) 5-1.5 MG/5ML syrup    Sig: Take 5 mLs by mouth every 6 (six) hours as needed for cough.    Dispense:  120 mL    Refill:  0     Follow-up: Return if symptoms worsen or fail to improve.  Mechele Claude, M.D.

## 2015-11-13 NOTE — Addendum Note (Signed)
Addended by: Bearl Mulberry on: 11/13/2015 06:05 PM   Modules accepted: Kipp Brood

## 2015-11-14 ENCOUNTER — Ambulatory Visit: Payer: Self-pay | Admitting: Family Medicine

## 2015-11-19 ENCOUNTER — Telehealth: Payer: Self-pay | Admitting: Family Medicine

## 2015-11-19 NOTE — Telephone Encounter (Signed)
Catherine Davis has had her papers, they were never given to me. Pt aware, Catherine Davis will have done today

## 2015-11-30 ENCOUNTER — Telehealth: Payer: Self-pay | Admitting: Family Medicine

## 2015-11-30 NOTE — Telephone Encounter (Signed)
Stp and she states she just needs a doctors note with the diagnosis of why she was out of work from 2/7 through 2/12 and faxed to [phone]. Letter printed and faxed. Pt is aware.

## 2015-12-04 ENCOUNTER — Telehealth: Payer: Self-pay | Admitting: Family Medicine

## 2015-12-04 NOTE — Telephone Encounter (Signed)
refaxed form with ICD 10 code

## 2017-04-22 ENCOUNTER — Ambulatory Visit (INDEPENDENT_AMBULATORY_CARE_PROVIDER_SITE_OTHER): Payer: BLUE CROSS/BLUE SHIELD | Admitting: Obstetrics and Gynecology

## 2017-04-22 ENCOUNTER — Encounter: Payer: Self-pay | Admitting: Obstetrics and Gynecology

## 2017-04-22 VITALS — BP 120/72 | HR 74 | Ht 70.0 in | Wt 244.2 lb

## 2017-04-22 DIAGNOSIS — Z3202 Encounter for pregnancy test, result negative: Secondary | ICD-10-CM | POA: Diagnosis not present

## 2017-04-22 DIAGNOSIS — O039 Complete or unspecified spontaneous abortion without complication: Secondary | ICD-10-CM | POA: Diagnosis not present

## 2017-04-22 DIAGNOSIS — Z30014 Encounter for initial prescription of intrauterine contraceptive device: Secondary | ICD-10-CM

## 2017-04-22 LAB — POCT URINE PREGNANCY: PREG TEST UR: NEGATIVE

## 2017-04-22 NOTE — Progress Notes (Addendum)
°   [facility] Visit  04/22/2017            Patient name: Catherine Malletshlee M Utt MRN [id number]  Date of birth: 11-05-1985  CC & HPI:  Catherine Davis is a 31 y.o. female presenting today for intermittent vaginal bleeding. Pt states the bleeding began 1 week ago and stopped on Saturday. She tested positive on a home pregnancy test on 03/20/17. Today's pregnancy test in office was negative. Pt wants to discuss permanent contraception options.   ROS:  ROS  -vaginal bleeding -fever  Pertinent History Reviewed:   Reviewed: Significant for ectopic pregnancy Medical        History reviewed. No pertinent past medical history.                            Surgical Hx:    Past Surgical History:  Procedure Laterality Date   ECTOPIC PREGNANCY SURGERY     Medications: Reviewed & Updated - see associated section                       Current Outpatient Prescriptions:    SPRINTEC 28 0.25-35 MG-MCG tablet, Take 1 tablet by mouth daily., Disp: , Rfl: 12   Social History: Reviewed -  reports that she has been smoking Cigarettes.  She has a 3.00 pack-year smoking history. She has never used smokeless tobacco.  Objective Findings:  Vitals: Blood pressure 120/72, pulse 74, height 5\' 10"  (1.778 m), weight 244 lb 3.2 oz (110.8 kg), not currently breastfeeding.  Physical Examination: General appearance - alert, well appearing, and in no distress Mental status - alert, oriented to person, place, and time Pelvic - not indicated  Discussion: 1. Discussed with pt risks and benefits of different contraception options (nexplanon, IUD,  At end of discussion, pt had opportunity to ask questions and has no further questions at this time.   Specific discussion of contraceptions as noted above. Greater than 50% was spent in counseling and coordination of care with the patient.   Total time greater than: 25 minutes.    Assessment & Plan:   A:  1. Resolved SAB 2. Contraception encounter for contraceptive  management  P:  1. Schedule IUD insertion in 4 weeks    By signing my name below, I, Izna Ahmed, attest that this documentation has been prepared under the direction and in the presence of Tilda BurrowFerguson, John V, MD. Electronically Signed: Redge GainerIzna Ahmed, ED Scribe. 04/22/17. 11:12 AM.  I personally performed the services described in this documentation, which was SCRIBED in my presence. The recorded information has been reviewed and considered accurate. It has been edited as necessary during review. Tilda BurrowFERGUSON,JOHN V, MD

## 2017-05-20 ENCOUNTER — Ambulatory Visit: Payer: BLUE CROSS/BLUE SHIELD | Admitting: Obstetrics and Gynecology

## 2017-08-11 ENCOUNTER — Ambulatory Visit: Payer: BLUE CROSS/BLUE SHIELD | Admitting: Adult Health

## 2019-04-11 ENCOUNTER — Telehealth: Payer: Self-pay | Admitting: Obstetrics and Gynecology

## 2019-04-11 NOTE — Telephone Encounter (Signed)
Pt requesting to speak with a nurse regarding having tubal ligation.

## 2019-04-11 NOTE — Telephone Encounter (Signed)
Left message @ 3:44 pm. JSY 

## 2019-04-12 NOTE — Telephone Encounter (Signed)
Pt is interested in a tubal. Call transferred to front desk for appt. [place]

## 2019-05-09 ENCOUNTER — Encounter: Payer: Self-pay | Admitting: Obstetrics and Gynecology

## 2019-05-09 ENCOUNTER — Ambulatory Visit: Payer: 59 | Admitting: Obstetrics and Gynecology

## 2019-05-09 ENCOUNTER — Other Ambulatory Visit: Payer: Self-pay

## 2019-05-09 DIAGNOSIS — Z3009 Encounter for other general counseling and advice on contraception: Secondary | ICD-10-CM | POA: Insufficient documentation

## 2019-05-09 NOTE — Patient Instructions (Signed)
Surgery to Prevent Pregnancy Female sterilization is surgery to prevent pregnancy. In this surgery, the fallopian tubes are either blocked or closed off. When the fallopian tubes are closed, the eggs that the ovaries release cannot enter the uterus, sperm cannot reach the eggs, and you cannot get pregnant. Sterilization is permanent. It should only be done if you are sure that you do not want to be able to have children. What are the sterilization surgery options? There are several kinds of female sterilization surgeries. They include:  Laparoscopic tubal ligation. In this surgery, the fallopian tubes are tied off, sealed with heat, or blocked with a clip, ring, or clamp. A small portion of each fallopian tube may also be removed. This surgery is done through several small cuts (incisions) with special instruments that are inserted into your abdomen.  Postpartum tubal ligation. This is also called a mini-laparotomy. This surgery is done right after childbirth or 1 or 2 days after childbirth. In this surgery, the fallopian tubes are tied off, sealed with heat, or blocked with a clip, ring, or clamp. A small portion of each fallopian tube may also be removed. The surgery is done through a single incision in the abdomen.  Tubal ligation during a C-section. In this surgery, the fallopian tubes are tied off, sealed with heat, or blocked with a clip, ring, or clamp. A small portion of each fallopian tube may also be removed. The surgery is done at the same time as a C-section delivery. Is sterilization safe? Generally, sterilization is safe. Complications are rare. However, there are risks. They include:  Bleeding.  Infection.  Reaction to medicine used during the procedure.  Injury to surrounding organs.  Failure of the procedure. How effective is sterilization? Sterilization is nearly 100% effective, but it can fail. In rare cases, the fallopian tubes can grow back together over time. If this  happens, pregnancy may be possible and you will be able to get pregnant again. Women who have had this procedure have a higher chance of having an ectopic pregnancy. An ectopic pregnancy is a pregnancy that happens outside of the uterus. This kind of pregnancy can lead to serious bleeding if it is not treated. What are the benefits?  It is usually effective for a lifetime.  It is usually safe.  It does not have the drawbacks of other types of birth control in that your hormones are not affected. Because of this, your menstrual periods, sexual desire, and sexual performance will not be affected. What are the drawbacks?  You will need to recover and may have complications after surgery.  If you change your mind and decide that you want to have children, you may not be able to. Sterilization may be reversed, but a reversal is not always successful.  It does not provide protection against STDs (sexually transmitted diseases).  It increases the chance of having an ectopic pregnancy. Follow these instructions at home:  Keep all follow-up visits as told by your health care provider. This is important. Summary  Female sterilization is surgery to prevent pregnancy.  There are different types of female sterilization surgeries.  Sterilization may be reversed, but a reversal is not always successful.  Sterilization does not protect against STDs. This information is not intended to replace advice given to you by your health care provider. Make sure you discuss any questions you have with your health care provider. Document Released: 03/10/2008 Document Revised: 06/03/2018 Document Reviewed: 06/04/2018 Elsevier Patient Education  2020 [employer]  

## 2019-05-09 NOTE — Progress Notes (Signed)
Catherine Davis presents today to discuss tubal ligation. Is G3P3 currently taking OCP's Has [employer] insurance Regular monthly cycles  PE AF VSS Lungs clear Heart RRR Ab soft + BS  A/P Unwanted fertility  Patient desires bilateral tubal sterilization.  Other reversible forms of contraception were discussed with patient; she declines all other modalities. Discussed bilateral tubal sterilization in detail; discussed options of laparoscopic bilateral tubal sterilization using Filshie clips vs laparoscopic bilateral salpingectomy. Risks and benefits discussed in detail including but not limited to: risk of regret, permanence of method, bleeding, infection, injury to surrounding organs and need for additional procedures.  Failure risk of 1-2 % for Filshie clips and <1% for bilateral salpingectomy with increased risk of ectopic gestation if pregnancy occurs was also discussed with patient.  Also discussed possible reduction of risk of ovarian cancer via bilateral salpingectomy given that a growing body of knowledge reveals that the majority of cases of high grade serous "ovarian" cancer actually are actually  cancers arising from the fimbriated end of the fallopian tubes. Emphasized that removal of fallopian tubes do not result in any known hormonal imbalance.  Patient verbalized understanding of these risks and benefits and wants to proceed with sterilization with laparoscopic bilateral sterilization using Filshie clips.    She was told that she will be contacted by our surgical scheduler regarding the time and date of her surgery; routine preoperative instructions of having nothing to eat or drink after midnight on the day prior to surgery and also coming to the hospital 1 1/2 hours prior to her time of surgery were also emphasized.  She was told she may be called for a preoperative appointment about a week prior to surgery and will be given further preoperative instructions at that visit.  Routine postoperative  instructions will be reviewed with the patient and her family in detail after surgery. Printed patient education handouts about the procedure was given to the patient to review at home.   She will follow up with post op appt.

## 2019-05-20 ENCOUNTER — Other Ambulatory Visit (HOSPITAL_COMMUNITY): Payer: Self-pay

## 2019-05-20 ENCOUNTER — Inpatient Hospital Stay (HOSPITAL_COMMUNITY): Admission: RE | Admit: 2019-05-20 | Payer: Self-pay | Source: Ambulatory Visit

## 2019-06-07 ENCOUNTER — Encounter: Payer: Self-pay | Admitting: Obstetrics and Gynecology

## 2019-06-23 NOTE — Patient Instructions (Signed)
Your procedure is scheduled on: 06/29/2019  Report to [facility] at  7:00   AM.  Call this number if you have problems the morning of surgery: [phone]   Remember:   Do not Eat or Drink after midnight   :  Take these medicines the morning of surgery with A SIP OF WATER: none   Do not wear jewelry, make-up or nail polish.  Do not wear lotions, powders, or perfumes. You may wear deodorant.  Do not shave 48 hours prior to surgery. Men may shave face and neck.  Do not bring valuables to the hospital.  Contacts, dentures or bridgework may not be worn into surgery.  Leave suitcase in the car. After surgery it may be brought to your room.  For patients admitted to the hospital, checkout time is 11:00 AM the day of discharge.   Patients discharged the day of surgery will not be allowed to drive home.    Special Instructions: Shower using CHG night before surgery and shower the day of surgery use CHG.  Use special wash - you have one bottle of CHG for all showers.  You should use approximately 1/2 of the bottle for each shower.  Laparoscopic Tubal Ligation Laparoscopic tubal ligation is a procedure to close the fallopian tubes. This is done so that you cannot get pregnant. When the fallopian tubes are closed, the eggs that your ovaries release cannot enter the uterus, and sperm cannot reach the released eggs. You should not have this procedure if you want to get pregnant someday or if you are unsure about having more children. Tell a health care provider about:  Any allergies you have.  All medicines you are taking, including vitamins, herbs, eye drops, creams, and over-the-counter medicines.  Any problems you or family members have had with anesthetic medicines.  Any blood disorders you have.  Any surgeries you have had.  Any medical conditions you have.  Whether you are pregnant or may be pregnant.  Any past pregnancies. What are the risks? Generally, this is a safe  procedure. However, problems may occur, including:  Infection.  Bleeding.  Injury to other organs in the abdomen.  Side effects from anesthetic medicines.  Failure of the procedure. This procedure can increase your risk of a kind of pregnancy in which a fertilized egg attaches to the outside of the uterus (ectopic pregnancy). What happens before the procedure? Medicines  Ask your health care provider about: ? Changing or stopping your regular medicines. This is especially important if you are taking diabetes medicines or blood thinners. ? Taking medicines such as aspirin and ibuprofen. These medicines can thin your blood. Do not take these medicines unless your health care provider tells you to take them. ? Taking over-the-counter medicines, vitamins, herbs, and supplements. Staying hydrated  Follow instructions from your health care provider about hydration, which may include: ? Up to 2 hours before the procedure - you may continue to drink clear liquids, such as water, clear fruit juice, black coffee, and plain tea. Eating and drinking  Follow instructions from your health care provider about eating and drinking, which may include: ? 8 hours before the procedure - stop eating heavy meals or foods, such as meat, fried foods, or fatty foods. ? 6 hours before the procedure - stop eating light meals or foods, such as toast or cereal. ? 6 hours before the procedure - stop drinking milk or drinks that contain milk. ? 2 hours before the procedure -  stop drinking clear liquids. General instructions  Do not use any products that contain nicotine or tobacco for at least 4 weeks before the procedure. These products include cigarettes, e-cigarettes, and chewing tobacco. If you need help quitting, ask your health care provider.  Plan to have someone take you home from the hospital.  If you will be going home right after the procedure, plan to have someone with you for 24 hours.  Ask your  health care provider: ? How your surgery site will be marked. ? What steps will be taken to help prevent infection. These may include:  Removing hair at the surgery site.  Washing skin with a germ-killing soap.  Taking antibiotic medicine. What happens during the procedure?      An IV will be inserted into one of your veins.  You will be given one or more of the following: ? A medicine to help you relax (sedative). ? A medicine to numb the area (local anesthetic). ? A medicine to make you fall asleep (general anesthetic). ? A medicine that is injected into an area of your body to numb everything below the injection site (regional anesthetic).  Your bladder may be emptied with a small tube (catheter).  If you have been given a general anesthetic, a tube will be put down your throat to help you breathe.  Two small incisions will be made in your lower abdomen and near your belly button.  Your abdomen will be inflated with a gas. This will let the surgeon see better and will give the surgeon room to work.  A thin, lighted tube (laparoscope) with a camera attached will be inserted into your abdomen through one of the incisions. Small instruments will be inserted through the other incision.  The fallopian tubes will be tied off, burned (cauterized), or blocked with a clip, ring, or clamp. A small portion in the center of each fallopian tube may be removed.  The gas will be released from the abdomen.  The incisions will be closed with stitches (sutures).  A bandage (dressing) will be placed over the incisions. The procedure may vary among health care providers and hospitals. What happens after the procedure?  Your blood pressure, heart rate, breathing rate, and blood oxygen level will be monitored until you leave the hospital.  You will be given medicine to help with pain, nausea, and vomiting as needed. Summary  Laparoscopic tubal ligation is a procedure that is done so that  you cannot get pregnant.  You should not have this procedure if you want to get pregnant someday or if you are unsure about having more children.  The procedure is done using a thin, lighted tube (laparoscope) with a camera attached that will be inserted into your abdomen through an incision.  Follow instructions from your health care provider about eating and drinking before the procedure. This information is not intended to replace advice given to you by your health care provider. Make sure you discuss any questions you have with your health care provider. Document Released: 12/29/2000 Document Revised: 08/17/2018 Document Reviewed: 08/17/2018 Elsevier Patient Education  [place].  Laparoscopic Appendectomy, Adult, Care After This sheet gives you information about how to care for yourself after your procedure. Your doctor may also give you more specific instructions. If you have problems or questions, contact your doctor. What can I expect after the procedure? After the procedure, it is common to have:  Little energy for normal activities.  Mild pain in the  area where the cuts from surgery (incisions) were made.  Trouble pooping (constipation). This can be caused by: ? Pain medicine. ? A lack of activity. Follow these instructions at home: Medicines  Take over-the-counter and prescription medicines only as told by your doctor.  If you were prescribed an antibiotic medicine, take it as told by your doctor. Do not stop taking it even if you start to feel better.  Do not drive or use heavy machinery while taking prescription pain medicine.  Ask your doctor if the medicine you are taking can cause trouble pooping. You may need to take steps to prevent or treat trouble pooping: ? Drink enough fluid to keep your pee (urine) pale yellow. ? Take over-the-counter or prescription medicines. ? Eat foods that are high in fiber. These include beans, whole grains, and fresh fruits  and vegetables. ? Limit foods that are high in fat and sugar. These include fried or sweet foods. Incision care   Follow instructions from your doctor about how to take care of your cuts from surgery. Make sure you: ? Wash your hands with soap and water before and after you change your bandage (dressing). If you cannot use soap and water, use hand sanitizer. ? Change your bandage as told by your doctor. ? Leave stitches (sutures), skin glue, or skin tape (adhesive) strips in place. They may need to stay in place for 2 weeks or longer. If tape strips get loose and curl up, you may trim the loose edges. Do not remove tape strips completely unless your doctor says it is okay.  Check your cuts from surgery every day for signs of infection. Check for: ? Redness, swelling, or pain. ? Fluid or blood. ? Warmth. ? Pus or a bad smell. Bathing  Keep your cuts from surgery clean and dry. Clean them as told by your doctor. To do this: 1. Gently wash the cuts with soap and water. 2. Rinse the cuts with water to remove all soap. 3. Pat the cuts dry with a clean towel. Do not rub the cuts.  Do not take baths, swim, or use a hot tub for 2 weeks, or until your doctor says it is okay. You may take showers after 48 hours. Activity   Do not drive for 24 hours if you were given a medicine to help you relax (sedative) during your procedure.  Rest after the procedure. Return to your normal activities as told by your doctor. Ask your doctor what activities are safe for you.  For 3 weeks, or for as long as told by your doctor: ? Do not lift anything that is heavier than 10 lb (4.5 kg), or the limit that you are told. ? Do not play contact sports. General instructions  If you were sent home with a drain, follow instructions from your doctor on how to care for it.  Take deep breaths. This helps to keep your lungs from getting an infection (pneumonia).  Keep all follow-up visits as told by your doctor.  This is important. Contact a doctor if:  You have redness, swelling, or pain around a cut from surgery.  You have fluid or blood coming from a cut.  Your cut feels warm to the touch.  You have pus or a bad smell coming from a cut or a bandage.  The edges of a cut break open after the stitches have been taken out.  You have pain in your shoulders that gets worse.  You feel dizzy or  you pass out (faint).  You have shortness of breath.  You keep feeling sick to your stomach (nauseous).  You keep throwing up (vomiting).  You get watery poop (diarrhea) or you cannot control your poop.  You lose your appetite.  You have swelling or pain in your legs.  You get a rash. Get help right away if:  You have a fever.  You have trouble breathing.  You have sharp pains in your chest. Summary  After the procedure, it is common to have low energy, mild pain, and trouble pooping.  Infection is a common problem after this procedure. Follow your doctor's instructions about caring for yourself after the procedure.  Rest after the procedure. Return to your normal activities as told by your doctor.  Contact your doctor if you see signs of infection around your cuts from surgery, or you get short of breath. Get help right away if you have a fever, chest pain, or trouble breathing. This information is not intended to replace advice given to you by your health care provider. Make sure you discuss any questions you have with your health care provider. Document Released: 07/19/2009 Document Revised: 03/25/2018 Document Reviewed: 03/25/2018 Elsevier Patient Education  2020 [employer]  General Anesthesia, Adult, Care After This sheet gives you information about how to care for yourself after your procedure. Your health care provider may also give you more specific instructions. If you have problems or questions, contact your health care provider. What can I expect after the procedure? After  the procedure, the following side effects are common:  Pain or discomfort at the IV site.  Nausea.  Vomiting.  Sore throat.  Trouble concentrating.  Feeling cold or chills.  Weak or tired.  Sleepiness and fatigue.  Soreness and body aches. These side effects can affect parts of the body that were not involved in surgery. Follow these instructions at home:  For at least 24 hours after the procedure:  Have a responsible adult stay with you. It is important to have someone help care for you until you are awake and alert.  Rest as needed.  Do not: ? Participate in activities in which you could fall or become injured. ? Drive. ? Use heavy machinery. ? Drink alcohol. ? Take sleeping pills or medicines that cause drowsiness. ? Make important decisions or sign legal documents. ? Take care of children on your own. Eating and drinking  Follow any instructions from your health care provider about eating or drinking restrictions.  When you feel hungry, start by eating small amounts of foods that are soft and easy to digest (bland), such as toast. Gradually return to your regular diet.  Drink enough fluid to keep your urine pale yellow.  If you vomit, rehydrate by drinking water, juice, or clear broth. General instructions  If you have sleep apnea, surgery and certain medicines can increase your risk for breathing problems. Follow instructions from your health care provider about wearing your sleep device: ? Anytime you are sleeping, including during daytime naps. ? While taking prescription pain medicines, sleeping medicines, or medicines that make you drowsy.  Return to your normal activities as told by your health care provider. Ask your health care provider what activities are safe for you.  Take over-the-counter and prescription medicines only as told by your health care provider.  If you smoke, do not smoke without supervision.  Keep all follow-up visits as told by  your health care provider. This is important. Contact a health  care provider if:  You have nausea or vomiting that does not get better with medicine.  You cannot eat or drink without vomiting.  You have pain that does not get better with medicine.  You are unable to pass urine.  You develop a skin rash.  You have a fever.  You have redness around your IV site that gets worse. Get help right away if:  You have difficulty breathing.  You have chest pain.  You have blood in your urine or stool, or you vomit blood. Summary  After the procedure, it is common to have a sore throat or nausea. It is also common to feel tired.  Have a responsible adult stay with you for the first 24 hours after general anesthesia. It is important to have someone help care for you until you are awake and alert.  When you feel hungry, start by eating small amounts of foods that are soft and easy to digest (bland), such as toast. Gradually return to your regular diet.  Drink enough fluid to keep your urine pale yellow.  Return to your normal activities as told by your health care provider. Ask your health care provider what activities are safe for you. This information is not intended to replace advice given to you by your health care provider. Make sure you discuss any questions you have with your health care provider. Document Released: 12/29/2000 Document Revised: 09/25/2017 Document Reviewed: 05/08/2017 Elsevier Patient Education  2020 [employer].

## 2019-06-26 ENCOUNTER — Other Ambulatory Visit: Payer: Self-pay | Admitting: Obstetrics & Gynecology

## 2019-06-27 ENCOUNTER — Encounter (HOSPITAL_COMMUNITY): Payer: Self-pay

## 2019-06-27 ENCOUNTER — Other Ambulatory Visit (HOSPITAL_COMMUNITY)
Admission: RE | Admit: 2019-06-27 | Discharge: 2019-06-27 | Disposition: A | Discharge status: Home / Self Care | Payer: BC Managed Care – PPO | Source: Ambulatory Visit | Attending: Obstetrics & Gynecology | Admitting: Obstetrics & Gynecology

## 2019-06-27 ENCOUNTER — Other Ambulatory Visit: Payer: Self-pay

## 2019-06-27 ENCOUNTER — Encounter (HOSPITAL_COMMUNITY)
Admission: RE | Admit: 2019-06-27 | Discharge: 2019-06-27 | Disposition: A | Discharge status: Home / Self Care | Payer: BC Managed Care – PPO | Source: Ambulatory Visit | Attending: Obstetrics & Gynecology | Admitting: Obstetrics & Gynecology

## 2019-06-27 DIAGNOSIS — Z01812 Encounter for preprocedural laboratory examination: Secondary | ICD-10-CM | POA: Insufficient documentation

## 2019-06-27 LAB — URINALYSIS, ROUTINE W REFLEX MICROSCOPIC
Bilirubin Urine: NEGATIVE
Glucose, UA: NEGATIVE mg/dL
Hgb urine dipstick: NEGATIVE
Ketones, ur: NEGATIVE mg/dL
Leukocytes,Ua: NEGATIVE
Nitrite: NEGATIVE
Protein, ur: NEGATIVE mg/dL
Specific Gravity, Urine: 1.018 (ref 1.005–1.030)
pH: 5 (ref 5.0–8.0)

## 2019-06-27 LAB — CBC
HCT: 42 % (ref 36.0–46.0)
Hemoglobin: 13.8 g/dL (ref 12.0–15.0)
MCH: 31.3 pg (ref 26.0–34.0)
MCHC: 32.9 g/dL (ref 30.0–36.0)
MCV: 95.2 fL (ref 80.0–100.0)
Platelets: 276 10*3/uL (ref 150–400)
RBC: 4.41 MIL/uL (ref 3.87–5.11)
RDW: 12 % (ref 11.5–15.5)
WBC: 4.3 10*3/uL (ref 4.0–10.5)
nRBC: 0 % (ref 0.0–0.2)

## 2019-06-27 LAB — COMPREHENSIVE METABOLIC PANEL
ALT: 20 U/L (ref 0–44)
AST: 22 U/L (ref 15–41)
Albumin: 4 g/dL (ref 3.5–5.0)
Alkaline Phosphatase: 66 U/L (ref 38–126)
Anion gap: 9 (ref 5–15)
BUN: 17 mg/dL (ref 6–20)
CO2: 22 mmol/L (ref 22–32)
Calcium: 8.6 mg/dL — ABNORMAL LOW (ref 8.9–10.3)
Chloride: 106 mmol/L (ref 98–111)
Creatinine, Ser: 0.77 mg/dL (ref 0.44–1.00)
GFR calc Af Amer: >60 mL/min (ref >60)
GFR calc non Af Amer: >60 mL/min (ref >60)
Glucose, Bld: 95 mg/dL (ref 70–99)
Potassium: 4 mmol/L (ref 3.5–5.1)
Sodium: 137 mmol/L (ref 135–145)
Total Bilirubin: 0.4 mg/dL (ref 0.3–1.2)
Total Protein: 7.5 g/dL (ref 6.5–8.1)

## 2019-06-27 LAB — RAPID HIV SCREEN (HIV 1/2 AB+AG)
HIV 1/2 Antibodies: NONREACTIVE
HIV-1 P24 Antigen - HIV24: NONREACTIVE

## 2019-06-27 LAB — HCG, QUANTITATIVE, PREGNANCY: hCG, Beta Chain, Quant, S: 1 m[IU]/mL (ref <5)

## 2019-06-27 LAB — SARS CORONAVIRUS 2 (TAT 6-24 HRS): SARS Coronavirus 2: NEGATIVE

## 2019-06-29 ENCOUNTER — Encounter (HOSPITAL_COMMUNITY): Payer: Self-pay | Admitting: *Deleted

## 2019-06-29 ENCOUNTER — Ambulatory Visit (HOSPITAL_COMMUNITY): Payer: BC Managed Care – PPO | Admitting: Anesthesiology

## 2019-06-29 ENCOUNTER — Ambulatory Visit (HOSPITAL_COMMUNITY)
Admission: RE | Admit: 2019-06-29 | Discharge: 2019-06-29 | Disposition: A | Discharge status: Home / Self Care | Payer: BC Managed Care – PPO | Attending: Obstetrics & Gynecology | Admitting: Obstetrics & Gynecology

## 2019-06-29 ENCOUNTER — Encounter (HOSPITAL_COMMUNITY)
Admission: RE | Disposition: A | Discharge status: Home / Self Care | Payer: Self-pay | Source: Home / Self Care | Attending: Obstetrics & Gynecology

## 2019-06-29 ENCOUNTER — Other Ambulatory Visit: Payer: Self-pay

## 2019-06-29 DIAGNOSIS — Z6839 Body mass index (BMI) 39.0-39.9, adult: Secondary | ICD-10-CM | POA: Insufficient documentation

## 2019-06-29 DIAGNOSIS — Z302 Encounter for sterilization: Secondary | ICD-10-CM

## 2019-06-29 DIAGNOSIS — F17210 Nicotine dependence, cigarettes, uncomplicated: Secondary | ICD-10-CM | POA: Diagnosis not present

## 2019-06-29 HISTORY — PX: LAPAROSCOPIC TUBAL LIGATION: SHX1937

## 2019-06-29 SURGERY — LIGATION, FALLOPIAN TUBE, LAPAROSCOPIC
Anesthesia: General | Laterality: Bilateral

## 2019-06-29 MED ORDER — DEXAMETHASONE SODIUM PHOSPHATE 10 MG/ML IJ SOLN
INTRAMUSCULAR | Status: AC
  Filled 2019-06-29: qty 1

## 2019-06-29 MED ORDER — BUPIVACAINE LIPOSOME 1.3 % IJ SUSP
INTRAMUSCULAR | Status: AC
  Filled 2019-06-29: qty 20

## 2019-06-29 MED ORDER — PROPOFOL 10 MG/ML IV BOLUS
INTRAVENOUS | Status: DC | PRN
  Administered 2019-06-29 (×2): 40 mg via INTRAVENOUS
  Administered 2019-06-29: 50 mg via INTRAVENOUS
  Administered 2019-06-29: 200 mg via INTRAVENOUS

## 2019-06-29 MED ORDER — LACTATED RINGERS IV SOLN
INTRAVENOUS | Status: DC
  Administered 2019-06-29: 08:00:00 1000 mL via INTRAVENOUS
  Administered 2019-06-29: 09:00:00 via INTRAVENOUS

## 2019-06-29 MED ORDER — HYDROMORPHONE HCL 1 MG/ML IJ SOLN: 0.2500 mg | INTRAMUSCULAR | Status: DC | PRN

## 2019-06-29 MED ORDER — PROMETHAZINE HCL 25 MG/ML IJ SOLN: 6.2500 mg | INTRAMUSCULAR | Status: DC | PRN

## 2019-06-29 MED ORDER — KETOROLAC TROMETHAMINE 30 MG/ML IJ SOLN
30.0000 mg | Freq: Once | INTRAMUSCULAR | Status: AC
  Administered 2019-06-29: 30 mg via INTRAVENOUS

## 2019-06-29 MED ORDER — ONDANSETRON HCL 4 MG/2ML IJ SOLN
INTRAMUSCULAR | Status: DC | PRN
  Administered 2019-06-29: 4 mg via INTRAVENOUS

## 2019-06-29 MED ORDER — LIDOCAINE HCL (CARDIAC) PF 100 MG/5ML IV SOSY
PREFILLED_SYRINGE | INTRAVENOUS | Status: DC | PRN
  Administered 2019-06-29: 100 mg via INTRAVENOUS

## 2019-06-29 MED ORDER — PROPOFOL 10 MG/ML IV BOLUS
INTRAVENOUS | Status: AC
  Filled 2019-06-29: qty 40

## 2019-06-29 MED ORDER — FENTANYL CITRATE (PF) 100 MCG/2ML IJ SOLN
INTRAMUSCULAR | Status: DC | PRN
  Administered 2019-06-29 (×4): 50 ug via INTRAVENOUS

## 2019-06-29 MED ORDER — ONDANSETRON 8 MG PO TBDP: 8.0000 mg | ORAL_TABLET | Freq: Three times a day (TID) | ORAL | 0 refills | Status: DC | PRN

## 2019-06-29 MED ORDER — KETOROLAC TROMETHAMINE 10 MG PO TABS: 10.0000 mg | ORAL_TABLET | Freq: Three times a day (TID) | ORAL | 0 refills | Status: DC | PRN

## 2019-06-29 MED ORDER — CEFAZOLIN SODIUM-DEXTROSE 2-4 GM/100ML-% IV SOLN
2.0000 g | INTRAVENOUS | Status: AC
  Administered 2019-06-29: 2 g via INTRAVENOUS

## 2019-06-29 MED ORDER — SUCCINYLCHOLINE CHLORIDE 200 MG/10ML IV SOSY
PREFILLED_SYRINGE | INTRAVENOUS | Status: AC
  Filled 2019-06-29: qty 10

## 2019-06-29 MED ORDER — ONDANSETRON HCL 4 MG/2ML IJ SOLN
INTRAMUSCULAR | Status: AC
  Filled 2019-06-29: qty 2

## 2019-06-29 MED ORDER — MIDAZOLAM HCL 2 MG/2ML IJ SOLN
INTRAMUSCULAR | Status: AC
  Filled 2019-06-29: qty 2

## 2019-06-29 MED ORDER — PROPOFOL 10 MG/ML IV BOLUS
INTRAVENOUS | Status: AC
  Filled 2019-06-29: qty 80

## 2019-06-29 MED ORDER — KETOROLAC TROMETHAMINE 30 MG/ML IJ SOLN
INTRAMUSCULAR | Status: AC
  Filled 2019-06-29: qty 1

## 2019-06-29 MED ORDER — SODIUM CHLORIDE 0.9 % IR SOLN
Status: DC | PRN
  Administered 2019-06-29: 1000 mL

## 2019-06-29 MED ORDER — FENTANYL CITRATE (PF) 250 MCG/5ML IJ SOLN
INTRAMUSCULAR | Status: AC
  Filled 2019-06-29: qty 5

## 2019-06-29 MED ORDER — DEXAMETHASONE SODIUM PHOSPHATE 4 MG/ML IJ SOLN
INTRAMUSCULAR | Status: DC | PRN
  Administered 2019-06-29: 8 mg via INTRAVENOUS

## 2019-06-29 MED ORDER — SUGAMMADEX SODIUM 500 MG/5ML IV SOLN
INTRAVENOUS | Status: DC | PRN
  Administered 2019-06-29: 250 mg via INTRAVENOUS

## 2019-06-29 MED ORDER — MIDAZOLAM HCL 2 MG/2ML IJ SOLN: 0.5000 mg | Freq: Once | INTRAMUSCULAR | Status: DC | PRN

## 2019-06-29 MED ORDER — ROCURONIUM BROMIDE 100 MG/10ML IV SOLN
INTRAVENOUS | Status: DC | PRN
  Administered 2019-06-29: 30 mg via INTRAVENOUS

## 2019-06-29 MED ORDER — CEFAZOLIN SODIUM-DEXTROSE 2-4 GM/100ML-% IV SOLN
INTRAVENOUS | Status: AC
  Filled 2019-06-29: qty 100

## 2019-06-29 MED ORDER — GLYCOPYRROLATE PF 0.2 MG/ML IJ SOSY
PREFILLED_SYRINGE | INTRAMUSCULAR | Status: AC
  Filled 2019-06-29: qty 1

## 2019-06-29 MED ORDER — SUCCINYLCHOLINE CHLORIDE 200 MG/10ML IV SOSY
PREFILLED_SYRINGE | INTRAVENOUS | Status: DC | PRN
  Administered 2019-06-29: 160 mg via INTRAVENOUS

## 2019-06-29 MED ORDER — MIDAZOLAM HCL 2 MG/2ML IJ SOLN
INTRAMUSCULAR | Status: DC | PRN
  Administered 2019-06-29 (×2): 1 mg via INTRAVENOUS

## 2019-06-29 MED ORDER — HYDROCODONE-ACETAMINOPHEN 7.5-325 MG PO TABS
1.0000 | ORAL_TABLET | Freq: Once | ORAL | Status: AC | PRN
  Administered 2019-06-29: 1 via ORAL
  Filled 2019-06-29: qty 1

## 2019-06-29 MED ORDER — GLYCOPYRROLATE 0.2 MG/ML IJ SOLN
INTRAMUSCULAR | Status: DC | PRN
  Administered 2019-06-29 (×2): 0.1 mg via INTRAVENOUS

## 2019-06-29 MED ORDER — SUGAMMADEX SODIUM 500 MG/5ML IV SOLN
INTRAVENOUS | Status: AC
  Filled 2019-06-29: qty 5

## 2019-06-29 MED ORDER — LIDOCAINE 2% (20 MG/ML) 5 ML SYRINGE
INTRAMUSCULAR | Status: AC
  Filled 2019-06-29: qty 5

## 2019-06-29 MED ORDER — HYDROCODONE-ACETAMINOPHEN 5-325 MG PO TABS: 1.0000 | ORAL_TABLET | Freq: Four times a day (QID) | ORAL | 0 refills | Status: DC | PRN

## 2019-06-29 MED ORDER — ROCURONIUM BROMIDE 10 MG/ML (PF) SYRINGE
PREFILLED_SYRINGE | INTRAVENOUS | Status: AC
  Filled 2019-06-29: qty 10

## 2019-06-29 SURGICAL SUPPLY — 37 items
ADH SKN CLS APL DERMABOND .7 (GAUZE/BANDAGES/DRESSINGS) ×1
BLADE SURG SZ11 CARB STEEL (BLADE) ×2 IMPLANT
CLOTH BEACON ORANGE TIMEOUT ST (SAFETY) ×2 IMPLANT
COVER LIGHT HANDLE STERIS (MISCELLANEOUS) ×4 IMPLANT
COVER WAND RF STERILE (DRAPES) ×2 IMPLANT
DERMABOND ADVANCED (GAUZE/BANDAGES/DRESSINGS) ×1
DERMABOND ADVANCED .7 DNX12 (GAUZE/BANDAGES/DRESSINGS) ×1 IMPLANT
ELECT REM PT RETURN 9FT ADLT (ELECTROSURGICAL) ×2
ELECTRODE REM PT RTRN 9FT ADLT (ELECTROSURGICAL) ×1 IMPLANT
GAUZE 4X4 16PLY RFD (DISPOSABLE) ×2 IMPLANT
GLOVE BIOGEL M 6.5 STRL (GLOVE) ×1 IMPLANT
GLOVE BIOGEL PI IND STRL 7.0 (GLOVE) ×3 IMPLANT
GLOVE BIOGEL PI IND STRL 8 (GLOVE) ×1 IMPLANT
GLOVE BIOGEL PI INDICATOR 7.0 (GLOVE) ×3
GLOVE BIOGEL PI INDICATOR 8 (GLOVE) ×1
GLOVE ECLIPSE 6.5 STRL STRAW (GLOVE) ×1 IMPLANT
GLOVE ECLIPSE 8.0 STRL XLNG CF (GLOVE) ×2 IMPLANT
GOWN STRL REUS W/TWL LRG LVL3 (GOWN DISPOSABLE) ×2 IMPLANT
GOWN STRL REUS W/TWL XL LVL3 (GOWN DISPOSABLE) ×2 IMPLANT
KIT TURNOVER CYSTO (KITS) ×2 IMPLANT
MANIFOLD NEPTUNE II (INSTRUMENTS) ×2 IMPLANT
NDL INSUFFLATION 14GA 120MM (NEEDLE) ×1 IMPLANT
NEEDLE HYPO 22GX1.5 SAFETY (NEEDLE) ×2 IMPLANT
NEEDLE INSUFFLATION 14GA 120MM (NEEDLE) ×2 IMPLANT
PACK PERI GYN (CUSTOM PROCEDURE TRAY) ×2 IMPLANT
PAD ARMBOARD 7.5X6 YLW CONV (MISCELLANEOUS) ×2 IMPLANT
SET BASIN LINEN APH (SET/KITS/TRAYS/PACK) ×2 IMPLANT
SOLUTION ANTI FOG 6CC (MISCELLANEOUS) ×2 IMPLANT
STAPLER VISISTAT 35W (STAPLE) IMPLANT
SUT VICRYL 0 UR6 27IN ABS (SUTURE) ×2 IMPLANT
SUT VICRYL AB 3-0 FS1 BRD 27IN (SUTURE) ×2 IMPLANT
SYR 10ML LL (SYRINGE) ×2 IMPLANT
SYR 20ML LL LF (SYRINGE) ×4 IMPLANT
TROCAR ENDO BLADELESS 11MM (ENDOMECHANICALS) ×2 IMPLANT
TROCAR XCEL NON-BLD 5MMX100MML (ENDOMECHANICALS) ×2 IMPLANT
TUBING INSUF HEATED (TUBING) ×2 IMPLANT
WARMER LAPAROSCOPE (MISCELLANEOUS) ×2 IMPLANT

## 2019-06-29 NOTE — Anesthesia Preprocedure Evaluation (Signed)
Anesthesia Evaluation  Patient identified by MRN, date of birth, ID band Patient awake    Reviewed: Allergy & Precautions, NPO status , Patient's Chart, lab work & pertinent test results  Airway Mallampati: II  TM Distance: >3 FB Neck ROM: Full    Dental no notable dental hx. (+) Teeth Intact   Pulmonary neg pulmonary ROS, Current Smoker and Patient abstained from smoking.,    Pulmonary exam normal breath sounds clear to auscultation       Cardiovascular Exercise Tolerance: Good negative cardio ROS Normal cardiovascular examI Rhythm:Regular Rate:Normal     Neuro/Psych negative neurological ROS  negative psych ROS   GI/Hepatic negative GI ROS, Neg liver ROS,   Endo/Other  Morbid obesity  Renal/GU negative Renal ROS  negative genitourinary   Musculoskeletal negative musculoskeletal ROS (+)   Abdominal   Peds negative pediatric ROS (+)  Hematology negative hematology ROS (+)   Anesthesia Other Findings   Reproductive/Obstetrics negative OB ROS                             Anesthesia Physical Anesthesia Plan  ASA: II  Anesthesia Plan: General   Post-op Pain Management:    Induction: Intravenous  PONV Risk Score and Plan: 2 and Ondansetron, Dexamethasone and Treatment may vary due to age or medical condition  Airway Management Planned: Oral ETT  Additional Equipment:   Intra-op Plan:   Post-operative Plan: Extubation in OR  Informed Consent: I have reviewed the patients History and Physical, chart, labs and discussed the procedure including the risks, benefits and alternatives for the proposed anesthesia with the patient or authorized representative who has indicated his/her understanding and acceptance.     Dental advisory given  Plan Discussed with: CRNA  Anesthesia Plan Comments: (Plan Full PPE use Plan GETA D/W PT -WTP with same after Q&A)        Anesthesia  Quick Evaluation

## 2019-06-29 NOTE — Anesthesia Postprocedure Evaluation (Signed)
Anesthesia Post Note  Patient: Catherine Davis  Procedure(s) Performed: LAPAROSCOPIC BILATERAL TUBAL LIGATION USING ELECTROCAUTERY (Bilateral )  Patient location during evaluation: PACU Anesthesia Type: General Level of consciousness: awake and alert Pain management: pain level controlled Vital Signs Assessment: post-procedure vital signs reviewed and stable Respiratory status: spontaneous breathing Cardiovascular status: stable Postop Assessment: no apparent nausea or vomiting Anesthetic complications: no     Last Vitals:  Vitals:   06/29/19 0813  BP: 120/83  Resp: 18  Temp: 37.1 C  SpO2: 97%    Last Pain:  Vitals:   06/29/19 0813  TempSrc: Oral  PainSc: 0-No pain                 Everette Rank

## 2019-06-29 NOTE — Anesthesia Procedure Notes (Signed)
Procedure Name: Intubation Date/Time: 06/29/2019 8:43 AM Performed by: Georgeanne Nim, CRNA Pre-anesthesia Checklist: Patient identified, Emergency Drugs available, Suction available, Patient being monitored and Timeout performed Patient Re-evaluated:Patient Re-evaluated prior to induction Oxygen Delivery Method: Circle system utilized Preoxygenation: Pre-oxygenation with 100% oxygen Induction Type: IV induction and Rapid sequence Laryngoscope Size: Mac and 4 Grade View: Grade I Tube size: 7.0 mm Number of attempts: 1 Airway Equipment and Method: Stylet Placement Confirmation: ETT inserted through vocal cords under direct vision,  positive ETCO2,  CO2 detector and breath sounds checked- equal and bilateral Secured at: 20 cm Tube secured with: Tape Dental Injury: Teeth and Oropharynx as per pre-operative assessment

## 2019-06-29 NOTE — Op Note (Signed)
Preoperative Diagnosis:  Multiparous female desires permanent sterilization  Postoperative Diagnosis:  Same as above  Procedure:  Laparoscopic Bilateral Tubal Ligation using electrocautery, s/p right salpingectomy for ectopic pregnancy  Surgeon:  Jacelyn Grip MD  Anaesthesia:  LMA  Findings:  Patient had normal pelvic anatomy and no intraperitoneal abnormalities.  Her right Fallopian tube was surgically absent except for a proximal stump.  Description of Operation:  Patient was taken to the OR and placed into supine position where she underwent LMA anaesthesia.  She was placed in the dorsal lithotomy position and prepped and draped in the usual sterile fashion.  An incision was made in the umbilicus and dissection taken down to the rectus fascia.  The rectus fascia was grasped with a coker clamp and a Veres needle was placed into the peritoneal cavity with 1 pass without difficulty.  The perioneal cavity was insufflated.    The non bladed trocar was then placed.  The above noted findings were observed.  A 5 mm non bladed trocar was placed in the LLQ under direct visualization without difficulty.  The [facility] Bipolar cautery electrocautery unit was employed and the left Fallopian tube were burned to no resistance and beyond in the distal ishtmic and ampullary regions, approximately a 3.5 cm segment bilaterally.  The right Fallopian tube was just a proximal stump and I also used the electrocautery on that segment as well.  No resistance was confirmed with the software graphing of current flow/resistance graphs.  There was good hemostasis.  The fascia, peritoneum and subcutaneous tissue were closed using 0 vicryl.  The skin was closed using 3-) vicryl in a subcuticular fashion.  The patient was awakened from anaesthesia and taken to the PACU with all counts being correct x 3.  The patient received Ancef 2 gram and Toradol 30 mg IV preoperatively.   Blood loss was 0 cc  Florian Buff, MD   06/29/2019 9:25 AM

## 2019-06-29 NOTE — H&P (Signed)
Preoperative History and Physical  Catherine Davis is a 33 y.o. [id number] with Patient's last menstrual period was 06/14/2019 (exact date). admitted for a lap BTL using electrocautery.    PMH:   History reviewed. No pertinent past medical history.  PSH:     Past Surgical History:  Procedure Laterality Date  . ECTOPIC PREGNANCY SURGERY      POb/GynH:      OB History    Gravida  7   Para  3   Term  3   Preterm      AB  4   Living  3     SAB  1   TAB  2   Ectopic  1   Multiple      Live Births  3           SH:   Social History   Tobacco Use  . Smoking status: Current Some Day Smoker    Packs/day: 1.00    Years: 3.00    Pack years: 3.00    Types: Cigarettes  . Smokeless tobacco: Never Used  Substance Use Topics  . Alcohol use: No  . Drug use: Yes    Types: Marijuana    Comment: every day    FH:    Family History  Problem Relation Age of Onset  . Diabetes Father   . Diabetes Other   . Diabetes Paternal Grandfather   . Diabetes Paternal Grandmother   . Cancer Maternal Grandmother   . Cirrhosis Maternal Grandfather   . Hypertension Mother      Allergies: No Known Allergies  Medications:      No current facility-administered medications for this encounter.   Review of Systems:   Review of Systems  Constitutional: Negative for fever, chills, weight loss, malaise/fatigue and diaphoresis.  HENT: Negative for hearing loss, ear pain, nosebleeds, congestion, sore throat, neck pain, tinnitus and ear discharge.   Eyes: Negative for blurred vision, double vision, photophobia, pain, discharge and redness.  Respiratory: Negative for cough, hemoptysis, sputum production, shortness of breath, wheezing and stridor.   Cardiovascular: Negative for chest pain, palpitations, orthopnea, claudication, leg swelling and PND.  Gastrointestinal: Positive for abdominal pain. Negative for heartburn, nausea, vomiting, diarrhea, constipation, blood in stool and melena.   Genitourinary: Negative for dysuria, urgency, frequency, hematuria and flank pain.  Musculoskeletal: Negative for myalgias, back pain, joint pain and falls.  Skin: Negative for itching and rash.  Neurological: Negative for dizziness, tingling, tremors, sensory change, speech change, focal weakness, seizures, loss of consciousness, weakness and headaches.  Endo/Heme/Allergies: Negative for environmental allergies and polydipsia. Does not bruise/bleed easily.  Psychiatric/Behavioral: Negative for depression, suicidal ideas, hallucinations, memory loss and substance abuse. The patient is not nervous/anxious and does not have insomnia.      PHYSICAL EXAM:  Last menstrual period 06/14/2019.    Vitals reviewed. Constitutional: She is oriented to person, place, and time. She appears well-developed and well-nourished.  HENT:  Head: Normocephalic and atraumatic.  Right Ear: External ear normal.  Left Ear: External ear normal.  Nose: Nose normal.  Mouth/Throat: Oropharynx is clear and moist.  Eyes: Conjunctivae and EOM are normal. Pupils are equal, round, and reactive to light. Right eye exhibits no discharge. Left eye exhibits no discharge. No scleral icterus.  Neck: Normal range of motion. Neck supple. No tracheal deviation present. No thyromegaly present.  Cardiovascular: Normal rate, regular rhythm, normal heart sounds and intact distal pulses.  Exam reveals no gallop and no friction rub.  No murmur heard. Respiratory: Effort normal and breath sounds normal. No respiratory distress. She has no wheezes. She has no rales. She exhibits no tenderness.  GI: Soft. Bowel sounds are normal. She exhibits no distension and no mass. There is tenderness. There is no rebound and no guarding.  Genitourinary:       Vulva is normal without lesions Vagina is pink moist without discharge Cervix normal in appearance and pap is normal Uterus is normal size, contour, position, consistency, mobility,  non-tender Adnexa is negative with normal sized ovaries by sonogram  Musculoskeletal: Normal range of motion. She exhibits no edema and no tenderness.  Neurological: She is alert and oriented to person, place, and time. She has normal reflexes. She displays normal reflexes. No cranial nerve deficit. She exhibits normal muscle tone. Coordination normal.  Skin: Skin is warm and dry. No rash noted. No erythema. No pallor.  Psychiatric: She has a normal mood and affect. Her behavior is normal. Judgment and thought content normal.    Labs: Results for orders placed or performed during the hospital encounter of 06/27/19 (from the past 336 hour(s))  Urinalysis, Routine w reflex microscopic   Collection Time: 06/27/19  8:00 AM  Result Value Ref Range   Color, Urine YELLOW YELLOW   APPearance CLEAR CLEAR   Specific Gravity, Urine 1.018 1.005 - 1.030   pH 5.0 5.0 - 8.0   Glucose, UA NEGATIVE NEGATIVE mg/dL   Hgb urine dipstick NEGATIVE NEGATIVE   Bilirubin Urine NEGATIVE NEGATIVE   Ketones, ur NEGATIVE NEGATIVE mg/dL   Protein, ur NEGATIVE NEGATIVE mg/dL   Nitrite NEGATIVE NEGATIVE   Leukocytes,Ua NEGATIVE NEGATIVE  CBC   Collection Time: 06/27/19  8:25 AM  Result Value Ref Range   WBC 4.3 4.0 - 10.5 K/uL   RBC 4.41 3.87 - 5.11 MIL/uL   Hemoglobin 13.8 12.0 - 15.0 g/dL   HCT 16.1 09.6 - 04.5 %   MCV 95.2 80.0 - 100.0 fL   MCH 31.3 26.0 - 34.0 pg   MCHC 32.9 30.0 - 36.0 g/dL   RDW 40.9 81.1 - 91.4 %   Platelets 276 150 - 400 K/uL   nRBC 0.0 0.0 - 0.2 %  Comprehensive metabolic panel   Collection Time: 06/27/19  8:25 AM  Result Value Ref Range   Sodium 137 135 - 145 mmol/L   Potassium 4.0 3.5 - 5.1 mmol/L   Chloride 106 98 - 111 mmol/L   CO2 22 22 - 32 mmol/L   Glucose, Bld 95 70 - 99 mg/dL   BUN 17 6 - 20 mg/dL   Creatinine, Ser 7.82 0.44 - 1.00 mg/dL   Calcium 8.6 (L) 8.9 - 10.3 mg/dL   Total Protein 7.5 6.5 - 8.1 g/dL   Albumin 4.0 3.5 - 5.0 g/dL   AST 22 15 - 41 U/L   ALT  20 0 - 44 U/L   Alkaline Phosphatase 66 38 - 126 U/L   Total Bilirubin 0.4 0.3 - 1.2 mg/dL   GFR calc non Af Amer >60 >60 mL/min   GFR calc Af Amer >60 >60 mL/min   Anion gap 9 5 - 15  hCG, quantitative, pregnancy   Collection Time: 06/27/19  8:25 AM  Result Value Ref Range   hCG, Beta Chain, Quant, S 1 <5 mIU/mL  Rapid HIV screen (HIV 1/2 Ab+Ag)   Collection Time: 06/27/19  8:25 AM  Result Value Ref Range   HIV-1 P24 Antigen - HIV24 NON REACTIVE NON REACTIVE  HIV 1/2 Antibodies NON REACTIVE NON REACTIVE   Interpretation (HIV Ag Ab)      A non reactive test result means that HIV 1 or HIV 2 antibodies and HIV 1 p24 antigen were not detected in the specimen.  Results for orders placed or performed during the hospital encounter of 06/27/19 (from the past 336 hour(s))  SARS CORONAVIRUS 2 (TAT 6-24 HRS) Nasopharyngeal Nasopharyngeal Swab   Collection Time: 06/27/19  7:05 AM   Specimen: Nasopharyngeal Swab  Result Value Ref Range   SARS Coronavirus 2 NEGATIVE NEGATIVE    EKG: Orders placed or performed during the hospital encounter of 04/04/14  . ED EKG  . ED EKG  . EKG 12-Lead  . EKG 12-Lead  . EKG    Imaging Studies: No results found.    Assessment: Multiparous female desires permanent sterilization  Plan: Laparoscopic bilateral tubal ligation using electrocautery  Lazaro Arms 06/29/2019 8:05 AM

## 2019-06-29 NOTE — Discharge Instructions (Signed)
Laparoscopic Tubal Ligation, Care After °This sheet gives you information about how to care for yourself after your procedure. Your health care provider may also give you more specific instructions. If you have problems or questions, contact your health care provider. °What can I expect after the procedure? °After the procedure, it is common to have: °· A sore throat. °· Discomfort in your shoulder. °· Mild discomfort or cramping in your abdomen. °· Gas pains. °· Pain or soreness in the area where the surgical incision was made. °· A bloated feeling. °· Tiredness. °· Nausea. °· Vomiting. °Follow these instructions at home: °Medicines °· Take over-the-counter and prescription medicines only as told by your health care provider. °· Do not take aspirin because it can cause bleeding. °· Ask your health care provider if the medicine prescribed to you: °? Requires you to avoid driving or using heavy machinery. °? Can cause constipation. You may need to take actions to prevent or treat constipation, such as: °§ Drink enough fluid to keep your urine pale yellow. °§ Take over-the-counter or prescription medicines. °§ Eat foods that are high in fiber, such as beans, whole grains, and fresh fruits and vegetables. °§ Limit foods that are high in fat and processed sugars, such as fried or sweet foods. °Incision care ° °  ° °· Follow instructions from your health care provider about how to take care of your incision. Make sure you: °? Wash your hands with soap and water before and after you change your bandage (dressing). If soap and water are not available, use hand sanitizer. °? Change your dressing as told by your health care provider. °? Leave stitches (sutures), skin glue, or adhesive strips in place. These skin closures may need to stay in place for 2 weeks or longer. If adhesive strip edges start to loosen and curl up, you may trim the loose edges. Do not remove adhesive strips completely unless your health care provider  tells you to do that. °· Check your incision area every day for signs of infection. Check for: °? Redness, swelling, or pain. °? Fluid or blood. °? Warmth. °? Pus or a bad smell. °Activity °· Rest as told by your health care provider. °· Avoid sitting for a long time without moving. Get up to take short walks every 1-2 hours. This is important to improve blood flow and breathing. Ask for help if you feel weak or unsteady. °· Return to your normal activities as told by your health care provider. Ask your health care provider what activities are safe for you. °General instructions °· Do not take baths, swim, or use a hot tub until your health care provider approves. Ask your health care provider if you may take showers. You may only be allowed to take sponge baths. °· Have someone help you with your daily household tasks for the first few days. °· Keep all follow-up visits as told by your health care provider. This is important. °Contact a health care provider if: °· You have redness, swelling, or pain around your incision. °· Your incision feels warm to the touch. °· You have pus or a bad smell coming from your incision. °· The edges of your incision break open after the sutures have been removed. °· Your pain does not improve after 2-3 days. °· You have a rash. °· You repeatedly become dizzy or light-headed. °· Your pain medicine is not helping. °Get help right away if you: °· Have a fever. °· Faint. °· Have increasing   pain in your abdomen. °· Have severe pain in one or both of your shoulders. °· Have fluid or blood coming from your sutures or from your vagina. °· Have shortness of breath or difficulty breathing. °· Have chest pain or leg pain. °· Have ongoing nausea, vomiting, or diarrhea. °Summary °· After the procedure, it is common to have mild discomfort or cramping in your abdomen. °· Take over-the-counter and prescription medicines only as told by your health care provider. °· Watch for symptoms that should  prompt you to call your health care provider. °· Keep all follow-up visits as told by your health care provider. This is important. °This information is not intended to replace advice given to you by your health care provider. Make sure you discuss any questions you have with your health care provider. °Document Released: 04/11/2005 Document Revised: 08/17/2018 Document Reviewed: 08/17/2018 °Elsevier Patient Education © 2020 [employer] ° ° ° ° ° ° °General Anesthesia, Adult, Care After °This sheet gives you information about how to care for yourself after your procedure. Your health care provider may also give you more specific instructions. If you have problems or questions, contact your health care provider. °What can I expect after the procedure? °After the procedure, the following side effects are common: °· Pain or discomfort at the IV site. °· Nausea. °· Vomiting. °· Sore throat. °· Trouble concentrating. °· Feeling cold or chills. °· Weak or tired. °· Sleepiness and fatigue. °· Soreness and body aches. These side effects can affect parts of the body that were not involved in surgery. °Follow these instructions at home: ° °For at least 24 hours after the procedure: °· Have a responsible adult stay with you. It is important to have someone help care for you until you are awake and alert. °· Rest as needed. °· Do not: °? Participate in activities in which you could fall or become injured. °? Drive. °? Use heavy machinery. °? Drink alcohol. °? Take sleeping pills or medicines that cause drowsiness. °? Make important decisions or sign legal documents. °? Take care of children on your own. °Eating and drinking °· Follow any instructions from your health care provider about eating or drinking restrictions. °· When you feel hungry, start by eating small amounts of foods that are soft and easy to digest (bland), such as toast. Gradually return to your regular diet. °· Drink enough fluid to keep your urine pale  yellow. °· If you vomit, rehydrate by drinking water, juice, or clear broth. °General instructions °· If you have sleep apnea, surgery and certain medicines can increase your risk for breathing problems. Follow instructions from your health care provider about wearing your sleep device: °? Anytime you are sleeping, including during daytime naps. °? While taking prescription pain medicines, sleeping medicines, or medicines that make you drowsy. °· Return to your normal activities as told by your health care provider. Ask your health care provider what activities are safe for you. °· Take over-the-counter and prescription medicines only as told by your health care provider. °· If you smoke, do not smoke without supervision. °· Keep all follow-up visits as told by your health care provider. This is important. °Contact a health care provider if: °· You have nausea or vomiting that does not get better with medicine. °· You cannot eat or drink without vomiting. °· You have pain that does not get better with medicine. °· You are unable to pass urine. °· You develop a skin rash. °· You have   a fever. °· You have redness around your IV site that gets worse. °Get help right away if: °· You have difficulty breathing. °· You have chest pain. °· You have blood in your urine or stool, or you vomit blood. °Summary °· After the procedure, it is common to have a sore throat or nausea. It is also common to feel tired. °· Have a responsible adult stay with you for the first 24 hours after general anesthesia. It is important to have someone help care for you until you are awake and alert. °· When you feel hungry, start by eating small amounts of foods that are soft and easy to digest (bland), such as toast. Gradually return to your regular diet. °· Drink enough fluid to keep your urine pale yellow. °· Return to your normal activities as told by your health care provider. Ask your health care provider what activities are safe for  you. °This information is not intended to replace advice given to you by your health care provider. Make sure you discuss any questions you have with your health care provider. °Document Released: 12/29/2000 Document Revised: 09/25/2017 Document Reviewed: 05/08/2017 °Elsevier Patient Education © 2020 Else[employer] Inc[employer] ° °

## 2019-06-29 NOTE — Transfer of Care (Signed)
Immediate Anesthesia Transfer of Care Note  Patient: Tishawna Larouche Seawright  Procedure(s) Performed: LAPAROSCOPIC BILATERAL TUBAL LIGATION USING ELECTROCAUTERY (Bilateral )  Patient Location: PACU  Anesthesia Type:General  Level of Consciousness: awake, alert , oriented and patient cooperative  Airway & Oxygen Therapy: Patient Spontanous Breathing  Post-op Assessment: Report given to RN and Post -op Vital signs reviewed and stable  Post vital signs: Reviewed and stable  Last Vitals:  Vitals Value Taken Time  BP 132/89 06/29/19 0936  Temp    Pulse 96 06/29/19 0940  Resp 14 06/29/19 0940  SpO2 98 % 06/29/19 0940  Vitals shown include unvalidated device data.  Last Pain:  Vitals:   06/29/19 0813  TempSrc: Oral  PainSc: 0-No pain         Complications: No apparent anesthesia complications

## 2019-06-30 ENCOUNTER — Encounter (HOSPITAL_COMMUNITY): Payer: Self-pay | Admitting: Obstetrics & Gynecology

## 2019-07-08 ENCOUNTER — Other Ambulatory Visit: Payer: Self-pay

## 2019-07-08 ENCOUNTER — Encounter: Payer: Self-pay | Admitting: Obstetrics & Gynecology

## 2019-07-08 ENCOUNTER — Ambulatory Visit (INDEPENDENT_AMBULATORY_CARE_PROVIDER_SITE_OTHER): Payer: BC Managed Care – PPO | Admitting: Obstetrics & Gynecology

## 2019-07-08 VITALS — BP 113/75 | HR 82 | Ht 70.0 in | Wt 279.0 lb

## 2019-07-08 DIAGNOSIS — Z9889 Other specified postprocedural states: Secondary | ICD-10-CM

## 2019-07-08 DIAGNOSIS — Z9851 Tubal ligation status: Secondary | ICD-10-CM

## 2019-07-08 NOTE — Progress Notes (Signed)
  HPI: Patient returns for routine postoperative follow-up having undergone lap BTL on 06/29/2019.  The patient's immediate postoperative recovery has been unremarkable. Since hospital discharge the patient reports no problems.   Current Outpatient Medications: HYDROcodone-acetaminophen (NORCO/VICODIN) 5-325 MG tablet, Take 1 tablet by mouth every 6 (six) hours as needed. (Patient not taking: Reported on 07/08/2019), Disp: 15 tablet, Rfl: 0 ketorolac (TORADOL) 10 MG tablet, Take 1 tablet (10 mg total) by mouth every 8 (eight) hours as needed. (Patient not taking: Reported on 07/08/2019), Disp: 15 tablet, Rfl: 0 levonorgestrel-ethinyl estradiol (ALESSE) 0.1-20 MG-MCG tablet, Take 1 tablet by mouth daily., Disp: , Rfl:  ondansetron (ZOFRAN ODT) 8 MG disintegrating tablet, Take 1 tablet (8 mg total) by mouth every 8 (eight) hours as needed for nausea or vomiting. (Patient not taking: Reported on 07/08/2019), Disp: 20 tablet, Rfl: 0 triamcinolone cream (KENALOG) 0.1 %, Apply 1 application topically 2 (two) times daily as needed for rash., Disp: , Rfl:   No current facility-administered medications for this visit.     Blood pressure 113/75, pulse 82, height 5\' 10"  (1.778 m), weight 279 lb (126.6 kg), last menstrual period 06/14/2019.  Physical Exam: Incisions x2 normal healing well Abdomen is benign  Diagnostic Tests:   Pathology:   Impression: S/p lap BTL  Plan:   Follow up: 1  years  Florian Buff, MD

## 2019-07-14 DIAGNOSIS — Z029 Encounter for administrative examinations, unspecified: Secondary | ICD-10-CM

## 2020-08-17 ENCOUNTER — Other Ambulatory Visit (HOSPITAL_COMMUNITY): Payer: Self-pay | Admitting: Internal Medicine

## 2020-08-17 MED FILL — FLUARIX QUADRIVALENT 0.5 ML: 0.5 | 1 days supply | Qty: 1 | Fill #0

## 2020-08-20 DIAGNOSIS — L304 Erythema intertrigo: Secondary | ICD-10-CM | POA: Diagnosis not present

## 2020-08-21 MED FILL — KETOCON/FLUTICASONE 1:1: 20 days supply | Qty: 60 | Fill #0

## 2020-11-08 MED FILL — KETOCON/FLUTICASONE 1:1: 20 days supply | Qty: 60 | Fill #1

## 2020-12-11 MED FILL — KETOCON/FLUTICASONE 1:1: 20 days supply | Qty: 60 | Fill #2

## 2021-01-07 ENCOUNTER — Other Ambulatory Visit (HOSPITAL_COMMUNITY): Payer: Self-pay

## 2021-01-07 MED ORDER — KETOCONAZOLE 2 % EX CREA
TOPICAL_CREAM | CUTANEOUS | 3 refills | Status: DC
  Filled 2021-01-07: qty 60, 20d supply, fill #0

## 2021-01-08 ENCOUNTER — Other Ambulatory Visit (HOSPITAL_COMMUNITY): Payer: Self-pay

## 2021-02-05 ENCOUNTER — Other Ambulatory Visit: Payer: Self-pay

## 2021-02-05 ENCOUNTER — Other Ambulatory Visit (HOSPITAL_COMMUNITY): Payer: Self-pay

## 2021-02-06 ENCOUNTER — Other Ambulatory Visit (HOSPITAL_COMMUNITY): Payer: Self-pay

## 2021-02-06 MED ORDER — KETOCONAZOLE 2 % EX CREA
TOPICAL_CREAM | CUTANEOUS | 3 refills | Status: DC
  Filled 2021-02-06 (×2): qty 60, 30d supply, fill #0
  Filled 2022-01-04 – 2022-01-17 (×2): qty 60, 30d supply, fill #1

## 2021-02-07 ENCOUNTER — Other Ambulatory Visit (HOSPITAL_COMMUNITY): Payer: Self-pay

## 2021-04-02 DIAGNOSIS — Z13228 Encounter for screening for other metabolic disorders: Secondary | ICD-10-CM | POA: Diagnosis not present

## 2021-04-02 DIAGNOSIS — Z131 Encounter for screening for diabetes mellitus: Secondary | ICD-10-CM | POA: Diagnosis not present

## 2021-04-02 DIAGNOSIS — Z1329 Encounter for screening for other suspected endocrine disorder: Secondary | ICD-10-CM | POA: Diagnosis not present

## 2021-04-02 DIAGNOSIS — Z13220 Encounter for screening for lipoid disorders: Secondary | ICD-10-CM | POA: Diagnosis not present

## 2021-04-02 DIAGNOSIS — Z0000 Encounter for general adult medical examination without abnormal findings: Secondary | ICD-10-CM | POA: Diagnosis not present

## 2021-04-02 DIAGNOSIS — Z01419 Encounter for gynecological examination (general) (routine) without abnormal findings: Secondary | ICD-10-CM | POA: Diagnosis not present

## 2022-01-03 ENCOUNTER — Telehealth: Payer: Self-pay | Admitting: Nurse Practitioner

## 2022-01-03 DIAGNOSIS — J069 Acute upper respiratory infection, unspecified: Secondary | ICD-10-CM

## 2022-01-04 ENCOUNTER — Other Ambulatory Visit (HOSPITAL_COMMUNITY): Payer: Self-pay

## 2022-01-04 MED ORDER — FLUTICASONE PROPIONATE 50 MCG/ACT NA SUSP: 2.0000 | Freq: Every day | NASAL | 0 refills | Status: DC

## 2022-01-04 MED ORDER — BENZONATATE 100 MG PO CAPS: 100.0000 mg | ORAL_CAPSULE | Freq: Two times a day (BID) | ORAL | 0 refills | Status: DC | PRN

## 2022-01-04 NOTE — Progress Notes (Signed)
I have spent 5 minutes in review of e-visit questionnaire, review and updating patient chart, medical decision making and response to patient.  ° °Kairee Kozma W Leliana Kontz, NP ° °  °

## 2022-01-04 NOTE — Progress Notes (Signed)
We are sorry that you are not feeling well.  Here is how we plan to help! ? ?Based on your presentation I believe you most likely have A cough due to a virus.  This is called viral bronchitis and is best treated by rest, plenty of fluids and control of the cough.  You may use Ibuprofen or Tylenol as directed to help your symptoms.   ?  ?In addition you may use A prescription cough medication called Tessalon Perles 100mg . You may take 1-2 capsules every 8 hours as needed for your cough and a nasal spray fluticasone for your nasal congestion.  ? ? ?From your responses in the eVisit questionnaire you describe inflammation in the upper respiratory tract which is causing a significant cough.  This is commonly called Bronchitis and has four common causes:   ?Allergies ?Viral Infections ?Acid Reflux ?Bacterial Infection ?Allergies, viruses and acid reflux are treated by controlling symptoms or eliminating the cause. An example might be a cough caused by taking certain blood pressure medications. You stop the cough by changing the medication. Another example might be a cough caused by acid reflux. Controlling the reflux helps control the cough. ? ?USE OF BRONCHODILATOR ("RESCUE") INHALERS: ?There is a risk from using your bronchodilator too frequently.  The risk is that over-reliance on a medication which only relaxes the muscles surrounding the breathing tubes can reduce the effectiveness of medications prescribed to reduce swelling and congestion of the tubes themselves.  Although you feel brief relief from the bronchodilator inhaler, your asthma may actually be worsening with the tubes becoming more swollen and filled with mucus.  This can delay other crucial treatments, such as oral steroid medications. If you need to use a bronchodilator inhaler daily, several times per day, you should discuss this with your provider.  There are probably better treatments that could be used to keep your asthma under control.  ?   ?HOME  CARE ?Only take medications as instructed by your medical team. ?Complete the entire course of an antibiotic. ?Drink plenty of fluids and get plenty of rest. ?Avoid close contacts especially the very young and the elderly ?Cover your mouth if you cough or cough into your sleeve. ?Always remember to wash your hands ?A steam or ultrasonic humidifier can help congestion.  ? ?GET HELP RIGHT AWAY IF: ?You develop worsening fever. ?You become short of breath ?You cough up blood. ?Your symptoms persist after you have completed your treatment plan ?MAKE SURE YOU  ?Understand these instructions. ?Will watch your condition. ?Will get help right away if you are not doing well or get worse. ?  ? ?Thank you for choosing an e-visit. ? ?Your e-visit answers were reviewed by a board certified advanced clinical practitioner to complete your personal care plan. Depending upon the condition, your plan could have included both over the counter or prescription medications. ? ?Please review your pharmacy choice. Make sure the pharmacy is open so you can pick up prescription now. If there is a problem, you may contact your provider through and have the prescription routed to another pharmacy.  Your safety is important to [employer]. If you have drug allergies check your prescription carefully.  ? ?For the next 24 hours you can use MyChart to ask questions about today's visit, request a non-urgent call back, or ask for a work or school excuse. ?You will get an email in the next two days asking about your experience. I hope that your e-visit has been valuable  been valuable and will speed your recovery.  

## 2022-01-06 ENCOUNTER — Other Ambulatory Visit (HOSPITAL_COMMUNITY): Payer: Self-pay

## 2022-01-17 ENCOUNTER — Other Ambulatory Visit (HOSPITAL_COMMUNITY): Payer: Self-pay

## 2022-01-26 ENCOUNTER — Other Ambulatory Visit: Payer: Self-pay | Admitting: Nurse Practitioner

## 2022-01-26 DIAGNOSIS — J069 Acute upper respiratory infection, unspecified: Secondary | ICD-10-CM

## 2022-01-27 NOTE — Telephone Encounter (Signed)
Requested medications are due for refill today.  unsure ? ?Requested medications are on the active medications list.  yes ? ?Last refill. 01/04/2022 18.2 0  refills ? ?Future visit scheduled.   no ? ?Notes to clinic.  Cristy Lendon Colonel is listed as PCP. ? ? ? ?Requested Prescriptions  ?Pending Prescriptions Disp Refills  ? fluticasone (FLONASE) 50 MCG/ACT nasal spray [Pharmacy Med Name: FLUTICASONE PROP 50 MCG SPRAY] 16 mL   ?  Sig: SPRAY 2 SPRAYS INTO EACH NOSTRIL EVERY DAY  ?  ? Not Delegated - Ear, Nose, and Throat: Nasal Preparations - Corticosteroids Failed - 01/26/2022  9:32 AM  ?  ?  Failed - This refill cannot be delegated  ?  ?  Failed - Valid encounter within last 12 months  ?  Recent Outpatient Visits   ?None ?  ?  ? ? ?  ?  ?  ?  ?

## 2022-02-14 ENCOUNTER — Encounter: Payer: Self-pay | Admitting: Emergency Medicine

## 2022-02-14 ENCOUNTER — Other Ambulatory Visit: Payer: Self-pay

## 2022-02-14 ENCOUNTER — Ambulatory Visit
Admission: EM | Admit: 2022-02-14 | Discharge: 2022-02-14 | Disposition: A | Discharge status: Home / Self Care | Payer: Self-pay | Attending: Emergency Medicine | Admitting: Emergency Medicine

## 2022-02-14 ENCOUNTER — Ambulatory Visit (INDEPENDENT_AMBULATORY_CARE_PROVIDER_SITE_OTHER): Payer: Self-pay

## 2022-02-14 DIAGNOSIS — R051 Acute cough: Secondary | ICD-10-CM

## 2022-02-14 DIAGNOSIS — J209 Acute bronchitis, unspecified: Secondary | ICD-10-CM

## 2022-02-14 MED ORDER — AEROCHAMBER PLUS MISC: 2 refills | Status: DC

## 2022-02-14 MED ORDER — PREDNISONE 20 MG PO TABS: 40.0000 mg | ORAL_TABLET | Freq: Every day | ORAL | 0 refills | Status: AC

## 2022-02-14 MED ORDER — PROMETHAZINE-DM 6.25-15 MG/5ML PO SYRP: 5.0000 mL | ORAL_SOLUTION | Freq: Four times a day (QID) | ORAL | 0 refills | Status: DC | PRN

## 2022-02-14 MED ORDER — ALBUTEROL SULFATE HFA 108 (90 BASE) MCG/ACT IN AERS: 1.0000 | INHALATION_SPRAY | RESPIRATORY_TRACT | 0 refills | Status: DC | PRN

## 2022-02-14 NOTE — ED Provider Notes (Signed)
HPI ? ?SUBJECTIVE: ? ?Catherine Davis is a 36 y.o. female who presents with 1 week of cough productive of yellowish to greenish sputum in the morning.  She is unable to sleep at night secondary to the cough.  She reports headache with coughing, occasional rhinorrhea, postnasal drip, posttussive emesis.  No fevers, nasal congestion, body aches, sinus pain or pressure, wheezing, chest pain, shortness of breath.  No GERD symptoms.  She has tried Delsym, Tessalon, ibuprofen, Tylenol, Sudafed and Flonase once without improvement in her symptoms.  No aggravating factors.  No antibiotics in the past 3 months.  No antipyretic in the past 6 hours.  She has a past medical history of smoking.  No history of pulmonary disease, GERD, allergies, diabetes, hypertension.  LMP: Last week.  Denies the possibility being pregnant.  [facility] ? ?History reviewed. No pertinent past medical history. ? ?Past Surgical History:  ?Procedure Laterality Date  ? ECTOPIC PREGNANCY SURGERY    ? LAPAROSCOPIC TUBAL LIGATION Bilateral 06/29/2019  ? Procedure: LAPAROSCOPIC BILATERAL TUBAL LIGATION USING ELECTROCAUTERY;  Surgeon: Lazaro ArmsEure, Luther H, MD;  Location: [facility];  Service: Gynecology;  Laterality: Bilateral;  ? ? ?Family History  ?Problem Relation Age of Onset  ? Diabetes Father   ? Diabetes Other   ? Diabetes Paternal Grandfather   ? Diabetes Paternal Grandmother   ? Cancer Maternal Grandmother   ? Cirrhosis Maternal Grandfather   ? Hypertension Mother   ? ? ?Social History  ? ?Tobacco Use  ? Smoking status: Some Days  ?  Packs/day: 1.00  ?  Years: 3.00  ?  Pack years: 3.00  ?  Types: Cigarettes  ? Smokeless tobacco: Never  ?Vaping Use  ? Vaping Use: Never used  ?Substance Use Topics  ? Alcohol use: No  ? Drug use: Yes  ?  Types: Marijuana  ?  Comment: every day  ? ? ?No current facility-administered medications for this encounter. ? ?Current Outpatient Medications:  ?  albuterol (VENTOLIN HFA) 108 (90 Base) MCG/ACT  inhaler, Inhale 1-2 puffs into the lungs every 4 (four) hours as needed for wheezing or shortness of breath., Disp: 1 each, Rfl: 0 ?  predniSONE (DELTASONE) 20 MG tablet, Take 2 tablets (40 mg total) by mouth daily with breakfast for 5 days., Disp: 10 tablet, Rfl: 0 ?  promethazine-dextromethorphan (PROMETHAZINE-DM) 6.25-15 MG/5ML syrup, Take 5 mLs by mouth 4 (four) times daily as needed for cough., Disp: 118 mL, Rfl: 0 ?  Spacer/Aero-Holding Chambers (AEROCHAMBER PLUS) inhaler, Use with inhaler, Disp: 1 each, Rfl: 2 ?  benzonatate (TESSALON) 100 MG capsule, Take 1 capsule (100 mg total) by mouth 2 (two) times daily as needed for cough., Disp: 20 capsule, Rfl: 0 ?  fluticasone (FLONASE) 50 MCG/ACT nasal spray, Place 2 sprays into both nostrils daily., Disp: 18.2 mL, Rfl: 0 ?  fluticasone 0.05%-ketoconazole 2% 1:1 cream mixture, Apply topically to affected areas on breast 2 times daily as needed., Disp: 60 g, Rfl: 3 ?  levonorgestrel-ethinyl estradiol (ALESSE) 0.1-20 MG-MCG tablet, Take 1 tablet by mouth daily., Disp: , Rfl:  ?  triamcinolone cream (KENALOG) 0.1 %, Apply 1 application topically 2 (two) times daily as needed for rash., Disp: , Rfl:  ? ?No Known Allergies ? ? ?ROS ? ?As noted in HPI.  ? ?Physical Exam ? ?BP (!) 137/93 (BP Location: Right Arm)   Pulse (!) 109   Temp 97.8 ?F (36.6 ?C) (Oral)   Resp 18   Ht 5\' 10"  (  1.778 m)   Wt 127 kg   LMP 02/11/2022 (Approximate)   SpO2 97%   BMI 40.18 kg/m?  ? ?Constitutional: Well developed, well nourished, no acute distress.  Coughing. ?Eyes:  EOMI, conjunctiva normal bilaterally ?HENT: Normocephalic, atraumatic,mucus membranes moist mild clear nasal congestion.  Normal turbinates.  No maxillary, frontal sinus tenderness.  No postnasal drip. ?Respiratory: Normal inspiratory effort, lungs clear bilaterally.  No anterior, lateral chest Gutzwiller tenderness. ?Cardiovascular: Regular tachycardia, no murmurs, rubs, gallop ?GI: nondistended ?skin: No rash, skin  intact ?Musculoskeletal: no deformities ?Neurologic: Alert & oriented x 3, no focal neuro deficits ?Psychiatric: Speech and behavior appropriate ? ? ?ED Course ? ? ?Medications - No data to display ? ?Orders Placed This Encounter  ?Procedures  ? DG Chest 2 View  ?  Standing Status:   Standing  ?  Number of Occurrences:   1  ?  Order Specific Question:   Reason for Exam (SYMPTOM  OR DIAGNOSIS REQUIRED)  ?  Answer:   cough  ?  Order Specific Question:   Is patient pregnant?  ?  Answer:   No  ? ? ?No results found for this or any previous visit (from the past 24 hour(s)). ?DG Chest 2 View ? ?Result Date: 02/14/2022 ?CLINICAL DATA:  36 year old female with cough. EXAM: CHEST - 2 VIEW COMPARISON:  04/04/2014 FINDINGS: The cardiomediastinal silhouette is unremarkable. Mild peribronchial thickening is unchanged. There is no evidence of focal airspace disease, pulmonary edema, suspicious pulmonary nodule/mass, pleural effusion, or pneumothorax. No acute bony abnormalities are identified. IMPRESSION: 1. No active cardiopulmonary disease. 2. Mild chronic peribronchial thickening. Electronically Signed   By: Harmon Pier M.D.   On: 02/14/2022 15:03   ? ?ED Clinical Impression ? ?1. Acute cough   ?2. Acute bronchitis, unspecified organism   ?  ? ?ED Assessment/Plan ? ?Reviewed imaging independently.  Mild chronic peribronchial thickening.  No evidence of pneumonia, effusion, pneumothorax, pulmonary nodule or mass.  See radiology report for full details. ? ?Chest x-ray suggestive of bronchitis.  Will send home with prednisone 40 mg for 5 days, regularly scheduled albuterol inhaler with a spacer, Promethazine DM, lemon/honey tea.  Start Flonase on a regular basis.  Withholding antibiotics at this time as she has no clear evidence for pneumonia.  Follow-up with PCP as needed ? ?Discussed limaging, MDM, treatment plan, and plan for follow-up with patient. patient agrees with plan.  ? ?Meds ordered this encounter  ?Medications  ?  predniSONE (DELTASONE) 20 MG tablet  ?  Sig: Take 2 tablets (40 mg total) by mouth daily with breakfast for 5 days.  ?  Dispense:  10 tablet  ?  Refill:  0  ? albuterol (VENTOLIN HFA) 108 (90 Base) MCG/ACT inhaler  ?  Sig: Inhale 1-2 puffs into the lungs every 4 (four) hours as needed for wheezing or shortness of breath.  ?  Dispense:  1 each  ?  Refill:  0  ? Spacer/Aero-Holding Chambers (AEROCHAMBER PLUS) inhaler  ?  Sig: Use with inhaler  ?  Dispense:  1 each  ?  Refill:  2  ?  Please educate patient on use  ? promethazine-dextromethorphan (PROMETHAZINE-DM) 6.25-15 MG/5ML syrup  ?  Sig: Take 5 mLs by mouth 4 (four) times daily as needed for cough.  ?  Dispense:  118 mL  ?  Refill:  0  ? ? ? ? ?*This clinic note was created using Scientist, clinical (histocompatibility and immunogenetics). Therefore, there may be occasional mistakes despite careful proofreading. ? ?? ? ?  ?  Domenick Gong, MD ?02/16/22 1141 ? ?

## 2022-02-14 NOTE — ED Triage Notes (Addendum)
Pt reports productive cough with yellow phlegm x1 week. Pt reports intermittent urinary incontinence and emesis with coughing episodes. Pt states "I don't feel back but this cough is relentless."  ? ?States has tried Teaching laboratory technician, and tessalon pearls with no relief of cough. ? ?denies fever, body aches, other symptoms at this time. No shortness of breath or chest pain noted. ? ? ?

## 2022-02-14 NOTE — Discharge Instructions (Addendum)
Finish the prednisone, unless a healthcare provider tells you to stop.  2 puffs from your albuterol inhaler every 4 hours for 2 days, then every 6 hours for 2 days, then as needed.  You can back off on the albuterol if you start to improve sooner.  Promethazine DM syrup.  You can continue trying the Tessalon.  Lemon juice and honey dissolved in hot water is very soothing for the throat.  Start using Flonase on a regular basis ?

## 2022-02-18 ENCOUNTER — Telehealth: Payer: Self-pay | Admitting: Family Medicine

## 2022-02-18 DIAGNOSIS — J9801 Acute bronchospasm: Secondary | ICD-10-CM

## 2022-02-18 MED ORDER — BENZONATATE 100 MG PO CAPS: 100.0000 mg | ORAL_CAPSULE | Freq: Two times a day (BID) | ORAL | 0 refills | Status: DC | PRN

## 2022-02-18 NOTE — Progress Notes (Signed)
We are sorry that you are not feeling well.  Here is how we plan to help! ? ?Based on your presentation I believe you most likely have A cough due to a virus.  This is called viral bronchitis and is best treated by rest, plenty of fluids and control of the cough.  You may use Ibuprofen or Tylenol as directed to help your symptoms.   ?  ?In addition you may use A prescription cough medication called Tessalon Perles 100mg . You may take 1-2 capsules every 8 hours as needed for your cough. ? ?When you stop smoking it will cause you to increase a cough, this is a normal response and good sign lungs are clearing. Use the above medication to help. ? ?Also continue to use the inhaler provided to you from ED ? ?From your responses in the eVisit questionnaire you describe inflammation in the upper respiratory tract which is causing a significant cough.  This is commonly called Bronchitis and has four common causes:   ?Allergies ?Viral Infections ?Acid Reflux ?Bacterial Infection ?Allergies, viruses and acid reflux are treated by controlling symptoms or eliminating the cause. An example might be a cough caused by taking certain blood pressure medications. You stop the cough by changing the medication. Another example might be a cough caused by acid reflux. Controlling the reflux helps control the cough. ? ?USE OF BRONCHODILATOR ("RESCUE") INHALERS: ?There is a risk from using your bronchodilator too frequently.  The risk is that over-reliance on a medication which only relaxes the muscles surrounding the breathing tubes can reduce the effectiveness of medications prescribed to reduce swelling and congestion of the tubes themselves.  Although you feel brief relief from the bronchodilator inhaler, your asthma may actually be worsening with the tubes becoming more swollen and filled with mucus.  This can delay other crucial treatments, such as oral steroid medications. If you need to use a bronchodilator inhaler daily, several  times per day, you should discuss this with your provider.  There are probably better treatments that could be used to keep your asthma under control.  ?   ?HOME CARE ?Only take medications as instructed by your medical team. ?Complete the entire course of an antibiotic. ?Drink plenty of fluids and get plenty of rest. ?Avoid close contacts especially the very young and the elderly ?Cover your mouth if you cough or cough into your sleeve. ?Always remember to wash your hands ?A steam or ultrasonic humidifier can help congestion.  ? ?GET HELP RIGHT AWAY IF: ?You develop worsening fever. ?You become short of breath ?You cough up blood. ?Your symptoms persist after you have completed your treatment plan ?MAKE SURE YOU  ?Understand these instructions. ?Will watch your condition. ?Will get help right away if you are not doing well or get worse. ?  ? ?Thank you for choosing an e-visit. ? ?Your e-visit answers were reviewed by a board certified advanced clinical practitioner to complete your personal care plan. Depending upon the condition, your plan could have included both over the counter or prescription medications. ? ?Please review your pharmacy choice. Make sure the pharmacy is open so you can pick up prescription now. If there is a problem, you may contact your provider through [employer] and have the prescription routed to another pharmacy.  Your safety is important to [place]. If you have drug allergies check your prescription carefully.  ? ?For the next 24 hours you can use MyChart to ask questions about today's visit, request a non-urgent call  back, or ask for a work or school excuse. ?You will get an email in the next two days asking about your experience. I hope that your e-visit has been valuable and will speed your recovery. ? ?I provided 5 minutes of non face-to-face time during this encounter for chart review, medication and order placement, as well as and documentation.  ? ?

## 2022-02-21 ENCOUNTER — Other Ambulatory Visit (HOSPITAL_COMMUNITY): Payer: Self-pay

## 2022-02-21 ENCOUNTER — Ambulatory Visit (INDEPENDENT_AMBULATORY_CARE_PROVIDER_SITE_OTHER): Payer: 59 | Admitting: Nurse Practitioner

## 2022-02-21 ENCOUNTER — Encounter: Payer: Self-pay | Admitting: Nurse Practitioner

## 2022-02-21 DIAGNOSIS — Z0000 Encounter for general adult medical examination without abnormal findings: Secondary | ICD-10-CM | POA: Insufficient documentation

## 2022-02-21 DIAGNOSIS — Z0001 Encounter for general adult medical examination with abnormal findings: Secondary | ICD-10-CM

## 2022-02-21 MED ORDER — SEMAGLUTIDE-WEIGHT MANAGEMENT 2.4 MG/0.75ML ~~LOC~~ SOAJ
2.4000 mg | SUBCUTANEOUS | 0 refills | Status: DC
  Filled 2022-02-21 – 2022-06-06 (×2): qty 3, 28d supply, fill #0

## 2022-02-21 MED ORDER — SEMAGLUTIDE-WEIGHT MANAGEMENT 0.25 MG/0.5ML ~~LOC~~ SOAJ
0.2500 mg | SUBCUTANEOUS | 0 refills | Status: AC
  Filled 2022-02-21: qty 2, 28d supply, fill #0

## 2022-02-21 MED ORDER — SEMAGLUTIDE-WEIGHT MANAGEMENT 0.5 MG/0.5ML ~~LOC~~ SOAJ
0.5000 mg | SUBCUTANEOUS | 0 refills | Status: AC
  Filled 2022-02-21 – 2022-03-11 (×2): qty 2, 28d supply, fill #0

## 2022-02-21 MED ORDER — SEMAGLUTIDE-WEIGHT MANAGEMENT 1 MG/0.5ML ~~LOC~~ SOAJ
1.0000 mg | SUBCUTANEOUS | 0 refills | Status: AC
  Filled 2022-02-21 – 2022-04-01 (×2): qty 2, 28d supply, fill #0

## 2022-02-21 MED ORDER — SEMAGLUTIDE-WEIGHT MANAGEMENT 1.7 MG/0.75ML ~~LOC~~ SOAJ
1.7000 mg | SUBCUTANEOUS | 0 refills | Status: AC
  Filled 2022-02-21 – 2022-04-29 (×2): qty 3, 28d supply, fill #0

## 2022-02-21 NOTE — Assessment & Plan Note (Signed)
Completed annual physical exam with labs.  Education provided to patient on health maintenance and preventative care.  Patient will complete health maintenance.

## 2022-02-21 NOTE — Progress Notes (Signed)
New Patient Note  RE: Catherine Davis MRN: [id number] DOB: April 01, 1986 Date of Office Visit: 02/21/2022  Chief Complaint: Establish Care (NEW - discuss weight mgmt )  History of Present Illness: .   Encounter for general adult medical examination  Physical: Patient's last physical exam was 1 year ago .  Weight: not appropriate for height (BMI greater  than 27%) ;  Blood Pressure: Normal (BP less than 120/80) ;  Medical History: Patient history reviewed ; Family history reviewed ;  Allergies Reviewed: No change in current allergies ;  Medications Reviewed: Medications reviewed - no changes ;  Lipids: Normal lipid levels ;labs completed results pending  Smoking: Life-long-smoker ; 1/2 pack a day Physical Activity: Exercises at least 3 times per week : No Alcohol/Drug Use: Is a non-drinker ; No illicit drug use ;  Patient is not afflicted from Stress Incontinence and Urge Incontinence  Safety: reviewed ; Patient wears a seat belt, has smoke detectors, has carbon monoxide detectors, , wears sunscreen with extended sun exposure. Dental Care: biannual cleanings, brushes and flosses daily. Ophthalmology/Optometry: n/a Hearing loss: none Vision impairments: none   Obesity: Patient complains of obesity. Patient cites health, increased physical ability, self-image as reasons for wanting to lose weight.  Obesity History Weight in late teens: unknown . Period of greatest weight gain: 279 lb during early adult years Lowest adult weight: 279 lb Highest adult weight: 279 lb  Amount of time at present weight: for a while   History of Weight Loss Efforts Greatest amount of weight lost: N/A Amount of time that loss was maintained: N/A Circumstances associated with regain of weight: N/A Successful weight loss techniques attempted: self-directed dieting Unsuccessful weight loss techniques attempted: self-directed dieting  Current Exercise Habits walking  Current Eating Habits Number of  regular meals per day: 3 Number of snacking episodes per day: a few Who shops for food? patient Who prepares food? patient Who eats with patient? patient Binge behavior?: no Purge behavior? no Anorexic behavior? no Eating precipitated by stress? Unknown Guilt feelings associated with eating? no  Other Potential Contributing Factors Use of alcohol: average did not ask. Use of medications that may cause weight gain  Antidepressant History of past abuse? N/A Psych History:  depression Comorbidities: none     02/21/2022    2:13 PM  GAD 7 : Generalized Anxiety Score  Nervous, Anxious, on Edge 0  Control/stop worrying 0  Worry too much - different things 0  Trouble relaxing 0  Restless 0  Easily annoyed or irritable 0  Afraid - awful might happen 0  Total GAD 7 Score 0  Anxiety Difficulty Not difficult at all     Flowsheet Row Office Visit from 02/21/2022 in [facility]  PHQ-9 Total Score 0        Assessment and Plan: Catherine Davis is a 36 y.o. female with: Morbid obesity (HCC) Completed labs-CBC, CMP, lipid panel, TSH.  Results pending.  Started patient on Wegovy.  Education provided to patient.  Rx sent to pharmacy.  Follow-up in 3 months.  Annual physical exam Completed annual physical exam with labs.  Education provided to patient on health maintenance and preventative care.  Patient will complete health maintenance.  Return in about 3 months (around 05/24/2022) for weight loss mgt.   Diagnostics:   Past Medical History: Patient Active Problem List   Diagnosis Date Noted   Morbid obesity (HCC) 02/21/2022   Annual physical exam 02/21/2022   Unwanted fertility 05/09/2019  History reviewed. No pertinent past medical history. Past Surgical History: Past Surgical History:  Procedure Laterality Date   ECTOPIC PREGNANCY SURGERY     LAPAROSCOPIC TUBAL LIGATION Bilateral 06/29/2019   Procedure: LAPAROSCOPIC BILATERAL TUBAL LIGATION USING  ELECTROCAUTERY;  Surgeon: Lazaro Arms, MD;  Location: [facility];  Service: Gynecology;  Laterality: Bilateral;   Medication List:  Current Outpatient Medications  Medication Sig Dispense Refill   Semaglutide-Weight Management 0.25 MG/0.5ML SOAJ Inject 0.25 mg into the skin once a week for 28 days. 2 mL 0   [START ON 03/22/2022] Semaglutide-Weight Management 0.5 MG/0.5ML SOAJ Inject 0.5 mg into the skin once a week for 28 days. 2 mL 0   [START ON 04/20/2022] Semaglutide-Weight Management 1 MG/0.5ML SOAJ Inject 1 mg into the skin once a week for 28 days. 2 mL 0   [START ON 05/19/2022] Semaglutide-Weight Management 1.7 MG/0.75ML SOAJ Inject 1.7 mg into the skin once a week for 28 days. 3 mL 0   [START ON 06/17/2022] Semaglutide-Weight Management 2.4 MG/0.75ML SOAJ Inject 2.4 mg into the skin once a week for 28 days. 3 mL 0   No current facility-administered medications for this visit.   Allergies: No Known Allergies Social History: Social History   Socioeconomic History   Marital status: Single    Spouse name: Not on file   Number of children: 3   Years of education: Not on file   Highest education level: Not on file  Occupational History   Not on file  Tobacco Use   Smoking status: Some Days    Packs/day: 0.50    Years: 3.00    Pack years: 1.50    Types: Cigarettes   Smokeless tobacco: Never  Vaping Use   Vaping Use: Never used  Substance and Sexual Activity   Alcohol use: No   Drug use: Yes    Types: Marijuana    Comment: every day   Sexual activity: Yes    Birth control/protection: Pill  Other Topics Concern   Not on file  Social History Narrative   Not on file   Social Determinants of Health   Financial Resource Strain: Not on file  Food Insecurity: Not on file  Transportation Needs: Not on file  Physical Activity: Not on file  Stress: Not on file  Social Connections: Not on file       Family History: Family History  Problem Relation Age of Onset    Hypertension Mother    Diabetes Father    Cancer Maternal Grandmother        GI cancer   Cirrhosis Maternal Grandfather    Diabetes Paternal Grandmother    Diabetes Paternal Grandfather    Diabetes Other          Review of Systems  Constitutional: Negative.   HENT: Negative.    Eyes: Negative.   Respiratory: Negative.    Gastrointestinal: Negative.   Genitourinary: Negative.   Musculoskeletal: Negative.   Skin: Negative.   Neurological: Negative.   Hematological: Negative.   Psychiatric/Behavioral: Negative.    Objective: BP 120/79   Pulse 87   Temp (!) 97.4 F (36.3 C)   Resp 20   Ht 5\' 10"  (1.778 m)   Wt 299 lb (135.6 kg)   LMP 02/11/2022 (Approximate)   SpO2 97%   BMI 42.90 kg/m  Body mass index is 42.9 kg/m. Physical Exam Vitals and nursing note reviewed.  Constitutional:      Appearance: She is obese.  HENT:  Head: Normocephalic.     Right Ear: Ear canal and external ear normal.     Left Ear: Ear canal and external ear normal.     Nose: Nose normal.     Mouth/Throat:     Mouth: Mucous membranes are moist.     Pharynx: Oropharynx is clear.  Eyes:     Conjunctiva/sclera: Conjunctivae normal.  Cardiovascular:     Rate and Rhythm: Normal rate and regular rhythm.     Pulses: Normal pulses.     Heart sounds: Normal heart sounds.  Pulmonary:     Effort: Pulmonary effort is normal.     Breath sounds: Normal breath sounds.  Abdominal:     General: Bowel sounds are normal.  Musculoskeletal:        General: Normal range of motion.  Skin:    General: Skin is warm.  Neurological:     General: No focal deficit present.     Mental Status: She is alert and oriented to person, place, and time.  Psychiatric:        Behavior: Behavior normal.   The plan was reviewed with the patient/family, and all questions/concerned were addressed.  It was my pleasure to see Bridgett Larssonshlee today and participate in her care. Please feel free to contact me with any questions or  concerns.  Sincerely,  Lynnell Chadnyeje Pietra Zuluaga NP [facility]

## 2022-02-21 NOTE — Assessment & Plan Note (Signed)
Completed labs-CBC, CMP, lipid panel, TSH.  Results pending.  Started patient on Wegovy.  Education provided to patient.  Rx sent to pharmacy.  Follow-up in 3 months.

## 2022-02-21 NOTE — Patient Instructions (Signed)

## 2022-02-25 ENCOUNTER — Telehealth: Payer: Self-pay | Admitting: *Deleted

## 2022-02-25 NOTE — Telephone Encounter (Signed)
Approved.  

## 2022-02-26 ENCOUNTER — Other Ambulatory Visit (HOSPITAL_COMMUNITY): Payer: Self-pay

## 2022-02-28 ENCOUNTER — Other Ambulatory Visit: Payer: 59

## 2022-02-28 DIAGNOSIS — Z0000 Encounter for general adult medical examination without abnormal findings: Secondary | ICD-10-CM | POA: Diagnosis not present

## 2022-03-01 LAB — LIPID PANEL
Chol/HDL Ratio: 5.6 ratio — ABNORMAL HIGH (ref 0.0–4.4)
Cholesterol, Total: 162 mg/dL (ref 100–199)
HDL: 29 mg/dL — ABNORMAL LOW (ref >39)
LDL Chol Calc (NIH): 112 mg/dL — ABNORMAL HIGH (ref 0–99)
Triglycerides: 114 mg/dL (ref 0–149)
VLDL Cholesterol Cal: 21 mg/dL (ref 5–40)

## 2022-03-01 LAB — COMPREHENSIVE METABOLIC PANEL
ALT: 18 IU/L (ref 0–32)
AST: 19 IU/L (ref 0–40)
Albumin/Globulin Ratio: 1.5 (ref 1.2–2.2)
Albumin: 4.5 g/dL (ref 3.8–4.8)
Alkaline Phosphatase: 97 IU/L (ref 44–121)
BUN/Creatinine Ratio: 10 (ref 9–23)
BUN: 9 mg/dL (ref 6–20)
Bilirubin Total: 0.5 mg/dL (ref 0.0–1.2)
CO2: 20 mmol/L (ref 20–29)
Calcium: 9.3 mg/dL (ref 8.7–10.2)
Chloride: 101 mmol/L (ref 96–106)
Creatinine, Ser: 0.86 mg/dL (ref 0.57–1.00)
Globulin, Total: 3 g/dL (ref 1.5–4.5)
Glucose: 92 mg/dL (ref 70–99)
Potassium: 4.2 mmol/L (ref 3.5–5.2)
Sodium: 134 mmol/L (ref 134–144)
Total Protein: 7.5 g/dL (ref 6.0–8.5)
eGFR: 91 mL/min/{1.73_m2} (ref >59)

## 2022-03-01 LAB — CBC WITH DIFFERENTIAL/PLATELET
Basophils Absolute: 0 10*3/uL (ref 0.0–0.2)
Basos: 0 %
EOS (ABSOLUTE): 0.2 10*3/uL (ref 0.0–0.4)
Eos: 4 %
Hematocrit: 36.7 % (ref 34.0–46.6)
Hemoglobin: 12.4 g/dL (ref 11.1–15.9)
Immature Grans (Abs): 0 10*3/uL (ref 0.0–0.1)
Immature Granulocytes: 0 %
Lymphocytes Absolute: 1.6 10*3/uL (ref 0.7–3.1)
Lymphs: 31 %
MCH: 29.1 pg (ref 26.6–33.0)
MCHC: 33.8 g/dL (ref 31.5–35.7)
MCV: 86 fL (ref 79–97)
Monocytes Absolute: 0.6 10*3/uL (ref 0.1–0.9)
Monocytes: 12 %
Neutrophils Absolute: 2.7 10*3/uL (ref 1.4–7.0)
Neutrophils: 53 %
Platelets: 298 10*3/uL (ref 150–450)
RBC: 4.26 x10E6/uL (ref 3.77–5.28)
RDW: 13.7 % (ref 11.7–15.4)
WBC: 5.1 10*3/uL (ref 3.4–10.8)

## 2022-03-01 LAB — TSH: TSH: 1.69 u[IU]/mL (ref 0.450–4.500)

## 2022-03-03 ENCOUNTER — Encounter: Payer: Self-pay | Admitting: Nurse Practitioner

## 2022-03-04 ENCOUNTER — Other Ambulatory Visit: Payer: Self-pay | Admitting: Nurse Practitioner

## 2022-03-04 ENCOUNTER — Telehealth: Payer: Self-pay | Admitting: Family Medicine

## 2022-03-04 DIAGNOSIS — R053 Chronic cough: Secondary | ICD-10-CM

## 2022-03-04 NOTE — Progress Notes (Signed)
[place]   Recurring cough post treatment over the last month with pred, inhaler, cough syrup and Perles.  Message detail of this sent for in person need.

## 2022-03-04 NOTE — Telephone Encounter (Signed)
Have you been seen in clinic for this symptoms? Is this recurrent from a previous visit? If not please make phone or video visit so I can best assess your symptoms to treat you appropriately. I hope you feel bettter soon.

## 2022-03-05 ENCOUNTER — Ambulatory Visit (INDEPENDENT_AMBULATORY_CARE_PROVIDER_SITE_OTHER): Payer: 59 | Admitting: Family Medicine

## 2022-03-05 ENCOUNTER — Encounter: Payer: Self-pay | Admitting: Family Medicine

## 2022-03-05 DIAGNOSIS — J019 Acute sinusitis, unspecified: Secondary | ICD-10-CM | POA: Diagnosis not present

## 2022-03-05 MED ORDER — AMOXICILLIN-POT CLAVULANATE 875-125 MG PO TABS: 1.0000 | ORAL_TABLET | Freq: Two times a day (BID) | ORAL | 0 refills | Status: AC

## 2022-03-05 NOTE — Progress Notes (Signed)
   Virtual Visit via Telephone Note  I connected with Catherine Davis on 03/05/22 at 3:42 PM by telephone and verified that I am speaking with the correct person using two identifiers. Catherine Davis is currently located at home and her son is currently with her during this visit. The provider, Gwenlyn Fudge, FNP is located in their office at time of visit.  I discussed the limitations, risks, security and privacy concerns of performing an evaluation and management service by telephone and the availability of in person appointments. I also discussed with the patient that there may be a patient responsible charge related to this service. The patient expressed understanding and agreed to proceed.  Subjective: PCP: Daryll Drown, NP  Chief Complaint  Patient presents with   URI   Patient complains of cough, head congestion, headache, runny nose, facial pain/pressure, and postnasal drainage. Onset of symptoms was a few weeks ago, gradually worsening since that time. She is drinking plenty of fluids. Evaluation to date: previously seen and diagnosed with bronchitis. Treatment to date: cough suppressants and Prednisone . She does smoke.    ROS: Per HPI  Current Outpatient Medications:    Semaglutide-Weight Management 0.25 MG/0.5ML SOAJ, Inject 0.25 mg into the skin once a week for 28 days., Disp: 2 mL, Rfl: 0   [START ON 03/22/2022] Semaglutide-Weight Management 0.5 MG/0.5ML SOAJ, Inject 0.5 mg into the skin once a week for 28 days., Disp: 2 mL, Rfl: 0   [START ON 04/20/2022] Semaglutide-Weight Management 1 MG/0.5ML SOAJ, Inject 1 mg into the skin once a week for 28 days., Disp: 2 mL, Rfl: 0   [START ON 05/19/2022] Semaglutide-Weight Management 1.7 MG/0.75ML SOAJ, Inject 1.7 mg into the skin once a week for 28 days., Disp: 3 mL, Rfl: 0   [START ON 06/17/2022] Semaglutide-Weight Management 2.4 MG/0.75ML SOAJ, Inject 2.4 mg into the skin once a week for 28 days., Disp: 3 mL, Rfl: 0  No Known  Allergies History reviewed. No pertinent past medical history.  Observations/Objective: A&O  No respiratory distress or wheezing audible over the phone Mood, judgement, and thought processes all WNL  Assessment and Plan: 1. Acute non-recurrent sinusitis, unspecified location Discussed symptom management. - amoxicillin-clavulanate (AUGMENTIN) 875-125 MG tablet; Take 1 tablet by mouth 2 (two) times daily for 7 days.  Dispense: 14 tablet; Refill: 0   Follow Up Instructions:  I discussed the assessment and treatment plan with the patient. The patient was provided an opportunity to ask questions and all were answered. The patient agreed with the plan and demonstrated an understanding of the instructions.   The patient was advised to call back or seek an in-person evaluation if the symptoms worsen or if the condition fails to improve as anticipated.  The above assessment and management plan was discussed with the patient. The patient verbalized understanding of and has agreed to the management plan. Patient is aware to call the clinic if symptoms persist or worsen. Patient is aware when to return to the clinic for a follow-up visit. Patient educated on when it is appropriate to go to the emergency department.   Time call ended: 3:53 PM  I provided 11 minutes of non-face-to-face time during this encounter.  Catherine Boston, MSN, APRN, FNP-C [facility] 03/05/22

## 2022-03-10 ENCOUNTER — Telehealth: Payer: 59 | Admitting: Nurse Practitioner

## 2022-03-11 ENCOUNTER — Other Ambulatory Visit (HOSPITAL_COMMUNITY): Payer: Self-pay

## 2022-04-01 ENCOUNTER — Other Ambulatory Visit (HOSPITAL_COMMUNITY): Payer: Self-pay

## 2022-04-29 ENCOUNTER — Other Ambulatory Visit (HOSPITAL_COMMUNITY): Payer: Self-pay

## 2022-05-16 ENCOUNTER — Encounter: Payer: Self-pay | Admitting: Nurse Practitioner

## 2022-05-16 ENCOUNTER — Ambulatory Visit (INDEPENDENT_AMBULATORY_CARE_PROVIDER_SITE_OTHER): Payer: 59 | Admitting: Nurse Practitioner

## 2022-05-16 VITALS — BP 110/71 | HR 78 | Temp 98.4°F | Ht 70.0 in | Wt 273.4 lb

## 2022-05-16 DIAGNOSIS — Z0000 Encounter for general adult medical examination without abnormal findings: Secondary | ICD-10-CM | POA: Diagnosis not present

## 2022-05-16 NOTE — Assessment & Plan Note (Signed)
Continue GLP-1 for weight loss patient is doing very well.  She has no side effect and she is continuing to lose weight.

## 2022-05-16 NOTE — Progress Notes (Signed)
Established Patient Office Visit  Subjective   Patient ID: Catherine Davis, female    DOB: July 04, 1986  Age: 36 y.o. MRN: [id number]  Chief Complaint  Patient presents with   Annual Exam    HPI   Encounter for general adult medical examination Physical : Patient's last physical exam was 1 year ago .  Weight: Appropriate for height (BMI less than 27%) ;  Blood Pressure: Normal (BP less than 120/80) ;  Medical History: Patient history reviewed ; Family history reviewed ;  Allergies Reviewed: No change in current allergies ;  Medications Reviewed: Medications reviewed - no changes ;  Lipids: Normal lipid levels ; labs completed results pending Smoking: Life-non-smoker ; currently working on smoking cessation Physical Activity: Exercises at least 3 times per week ; yes  Alcohol/Drug Use: Is a non-drinker ; No illicit drug use ;  Patient is not afflicted from Stress Incontinence and Urge Incontinence  Safety: reviewed ; Patient wears a seat belt, has smoke detectors, has carbon monoxide detectors, practices appropriate gun safety, and wears sunscreen with extended sun exposure. Dental Care: biannual cleanings, brushes and flosses daily. Ophthalmology/Optometry: Annual visit.  Hearing loss: none Vision impairments: none    Weight loss Patient Active Problem List   Diagnosis Date Noted   Morbid obesity ([place]) 02/21/2022   Annual physical exam 02/21/2022   Unwanted fertility 05/09/2019   History reviewed. No pertinent past medical history. Past Surgical History:  Procedure Laterality Date   ECTOPIC PREGNANCY SURGERY     LAPAROSCOPIC TUBAL LIGATION Bilateral 06/29/2019   Procedure: LAPAROSCOPIC BILATERAL TUBAL LIGATION USING ELECTROCAUTERY;  Surgeon: Florian Buff, MD;  Location: [facility];  Service: Gynecology;  Laterality: Bilateral;   Social History   Tobacco Use   Smoking status: Some Days    Packs/day: 0.50    Years: 3.00    Total pack years: 1.50    Types: Cigarettes    Smokeless tobacco: Never  Vaping Use   Vaping Use: Never used  Substance Use Topics   Alcohol use: No   Drug use: Yes    Types: Marijuana    Comment: every day   Social History   Socioeconomic History   Marital status: Single    Spouse name: Not on file   Number of children: 3   Years of education: Not on file   Highest education level: Not on file  Occupational History   Not on file  Tobacco Use   Smoking status: Some Days    Packs/day: 0.50    Years: 3.00    Total pack years: 1.50    Types: Cigarettes   Smokeless tobacco: Never  Vaping Use   Vaping Use: Never used  Substance and Sexual Activity   Alcohol use: No   Drug use: Yes    Types: Marijuana    Comment: every day   Sexual activity: Yes    Birth control/protection: Pill  Other Topics Concern   Not on file  Social History Narrative   Not on file   Social Determinants of Health   Financial Resource Strain: Not on file  Food Insecurity: Not on file  Transportation Needs: Not on file  Physical Activity: Not on file  Stress: Not on file  Social Connections: Not on file  Intimate Partner Violence: Not on file   Family Status  Relation Name Status   Mother  Alive   Father  Alive   Sister  Alive   Sister  Alive   Brother  Alive   Brother  [employer]   Daughter  Alive   Son  Alive   Son  Alive   MGM  Deceased   MGF  Deceased   PGM  Alive   PGF  Deceased   Other  (Not Specified)   Family History  Problem Relation Age of Onset   Hypertension Mother    Diabetes Father    Cancer Maternal Grandmother        GI cancer   Cirrhosis Maternal Grandfather    Diabetes Paternal Grandmother    Diabetes Paternal Grandfather    Diabetes Other    No Known Allergies    Review of Systems  Constitutional: Negative.   HENT: Negative.    Eyes: Negative.   Respiratory: Negative.    Cardiovascular: Negative.   Genitourinary: Negative.   Musculoskeletal: Negative.   Skin: Negative.  Negative  for itching and rash.  All other systems reviewed and are negative.     Objective:     BP 110/71   Pulse 78   Temp 98.4 F (36.9 C)   Ht 5' 10"  (1.778 m)   Wt 273 lb 6.4 oz (124 kg)   LMP 04/17/2022 (Approximate) Comment: no bc  SpO2 98%   BMI 39.23 kg/m  BP Readings from Last 3 Encounters:  05/16/22 110/71  02/21/22 120/79  02/14/22 (!) 137/93   Wt Readings from Last 3 Encounters:  05/16/22 273 lb 6.4 oz (124 kg)  02/21/22 299 lb (135.6 kg)  02/14/22 280 lb (127 kg)      Physical Exam Vitals and nursing note reviewed.  Constitutional:      Appearance: Normal appearance. She is obese.  HENT:     Head: Normocephalic.     Right Ear: Ear canal and external ear normal.     Left Ear: Ear canal and external ear normal. There is no impacted cerumen.     Nose: Nose normal.     Mouth/Throat:     Mouth: Mucous membranes are moist.     Pharynx: Oropharynx is clear.  Eyes:     Conjunctiva/sclera: Conjunctivae normal.  Cardiovascular:     Rate and Rhythm: Normal rate and regular rhythm.     Pulses: Normal pulses.     Heart sounds: Normal heart sounds.  Pulmonary:     Effort: Pulmonary effort is normal.     Breath sounds: Normal breath sounds.  Abdominal:     General: Bowel sounds are normal.  Musculoskeletal:        General: Normal range of motion.  Skin:    General: Skin is warm.     Findings: No erythema or rash.  Neurological:     General: No focal deficit present.     Mental Status: She is alert and oriented to person, place, and time.      No results found for any visits on 05/16/22.  Last CBC Lab Results  Component Value Date   WBC 5.1 02/28/2022   HGB 12.4 02/28/2022   HCT 36.7 02/28/2022   MCV 86 02/28/2022   MCH 29.1 02/28/2022   RDW 13.7 02/28/2022   PLT 298 74/05/1447   Last metabolic panel Lab Results  Component Value Date   GLUCOSE 92 02/28/2022   NA 134 02/28/2022   K 4.2 02/28/2022   CL 101 02/28/2022   CO2 20 02/28/2022   BUN 9  02/28/2022   CREATININE 0.86 02/28/2022   EGFR 91 02/28/2022   CALCIUM 9.3  02/28/2022   PROT 7.5 02/28/2022   ALBUMIN 4.5 02/28/2022   LABGLOB 3.0 02/28/2022   AGRATIO 1.5 02/28/2022   BILITOT 0.5 02/28/2022   ALKPHOS 97 02/28/2022   AST 19 02/28/2022   ALT 18 02/28/2022   ANIONGAP 9 06/27/2019   Last lipids Lab Results  Component Value Date   CHOL 162 02/28/2022   HDL 29 (L) 02/28/2022   LDLCALC 112 (H) 02/28/2022   TRIG 114 02/28/2022   CHOLHDL 5.6 (H) 02/28/2022   Last hemoglobin A1c No results found for: "HGBA1C" Last thyroid functions Lab Results  Component Value Date   TSH 1.690 02/28/2022   Last vitamin D No results found for: "25OHVITD2", "25OHVITD3", "VD25OH" Last vitamin B12 and Folate No results found for: "VITAMINB12", "FOLATE"    The ASCVD Risk score (Arnett DK, et al., 2019) failed to calculate for the following reasons:   The 2019 ASCVD risk score is only valid for ages 47 to 69    Assessment & Plan:  Complete head to toe assessment patient has no new concerns.   Labs completed today-CBC, CMP, lipid panel.  Education provided on health maintenance and preventative care.   Pap was completed a year ago.  Patient is currently working on smoking cessation.   Problem List Items Addressed This Visit       Other   Morbid obesity ([place])    Continue GLP-1 for weight loss patient is doing very well.  She has no side effect and she is continuing to lose weight.      Annual physical exam - Primary   Relevant Orders   CBC with Differential/Platelet   Comprehensive metabolic panel   Lipid panel   TSH   VITAMIN D 25 Hydroxy (Vit-D Deficiency, Fractures)    Return in about 1 year (around 05/17/2023) for Annual physical .    Ivy Lynn, NP

## 2022-05-16 NOTE — Patient Instructions (Signed)

## 2022-05-26 ENCOUNTER — Ambulatory Visit: Payer: 59 | Admitting: Nurse Practitioner

## 2022-05-28 ENCOUNTER — Other Ambulatory Visit: Payer: 59

## 2022-05-28 DIAGNOSIS — Z0000 Encounter for general adult medical examination without abnormal findings: Secondary | ICD-10-CM

## 2022-05-29 LAB — CBC WITH DIFFERENTIAL/PLATELET
Basophils Absolute: 0 10*3/uL (ref 0.0–0.2)
Basos: 1 %
EOS (ABSOLUTE): 0.1 10*3/uL (ref 0.0–0.4)
Eos: 4 %
Hematocrit: 38.7 % (ref 34.0–46.6)
Hemoglobin: 13.1 g/dL (ref 11.1–15.9)
Immature Grans (Abs): 0 10*3/uL (ref 0.0–0.1)
Immature Granulocytes: 0 %
Lymphocytes Absolute: 1.7 10*3/uL (ref 0.7–3.1)
Lymphs: 46 %
MCH: 29.7 pg (ref 26.6–33.0)
MCHC: 33.9 g/dL (ref 31.5–35.7)
MCV: 88 fL (ref 79–97)
Monocytes Absolute: 0.4 10*3/uL (ref 0.1–0.9)
Monocytes: 12 %
Neutrophils Absolute: 1.4 10*3/uL (ref 1.4–7.0)
Neutrophils: 37 %
Platelets: 286 10*3/uL (ref 150–450)
RBC: 4.41 x10E6/uL (ref 3.77–5.28)
RDW: 13.2 % (ref 11.7–15.4)
WBC: 3.7 10*3/uL (ref 3.4–10.8)

## 2022-05-29 LAB — LIPID PANEL
Chol/HDL Ratio: 5.3 ratio — ABNORMAL HIGH (ref 0.0–4.4)
Cholesterol, Total: 144 mg/dL (ref 100–199)
HDL: 27 mg/dL — ABNORMAL LOW (ref >39)
LDL Chol Calc (NIH): 90 mg/dL (ref 0–99)
Triglycerides: 153 mg/dL — ABNORMAL HIGH (ref 0–149)
VLDL Cholesterol Cal: 27 mg/dL (ref 5–40)

## 2022-05-29 LAB — COMPREHENSIVE METABOLIC PANEL
ALT: 12 IU/L (ref 0–32)
AST: 16 IU/L (ref 0–40)
Albumin/Globulin Ratio: 1.5 (ref 1.2–2.2)
Albumin: 4.4 g/dL (ref 3.9–4.9)
Alkaline Phosphatase: 92 IU/L (ref 44–121)
BUN/Creatinine Ratio: 11 (ref 9–23)
BUN: 11 mg/dL (ref 6–20)
Bilirubin Total: 0.3 mg/dL (ref 0.0–1.2)
CO2: 20 mmol/L (ref 20–29)
Calcium: 9.3 mg/dL (ref 8.7–10.2)
Chloride: 102 mmol/L (ref 96–106)
Creatinine, Ser: 0.96 mg/dL (ref 0.57–1.00)
Globulin, Total: 3 g/dL (ref 1.5–4.5)
Glucose: 81 mg/dL (ref 70–99)
Potassium: 4.4 mmol/L (ref 3.5–5.2)
Sodium: 137 mmol/L (ref 134–144)
Total Protein: 7.4 g/dL (ref 6.0–8.5)
eGFR: 80 mL/min/{1.73_m2} (ref >59)

## 2022-05-29 LAB — VITAMIN D 25 HYDROXY (VIT D DEFICIENCY, FRACTURES): Vit D, 25-Hydroxy: 11.4 ng/mL — ABNORMAL LOW (ref 30.0–100.0)

## 2022-05-29 LAB — TSH: TSH: 1.3 u[IU]/mL (ref 0.450–4.500)

## 2022-06-02 ENCOUNTER — Other Ambulatory Visit: Payer: Self-pay | Admitting: Nurse Practitioner

## 2022-06-02 DIAGNOSIS — E559 Vitamin D deficiency, unspecified: Secondary | ICD-10-CM

## 2022-06-02 MED ORDER — VITAMIN D (ERGOCALCIFEROL) 1.25 MG (50000 UNIT) PO CAPS: 50000.0000 [IU] | ORAL_CAPSULE | ORAL | 0 refills | Status: DC

## 2022-06-06 ENCOUNTER — Other Ambulatory Visit (HOSPITAL_COMMUNITY): Payer: Self-pay

## 2022-06-22 ENCOUNTER — Other Ambulatory Visit: Payer: Self-pay | Admitting: Nurse Practitioner

## 2022-06-22 DIAGNOSIS — E559 Vitamin D deficiency, unspecified: Secondary | ICD-10-CM

## 2022-07-07 ENCOUNTER — Other Ambulatory Visit (HOSPITAL_COMMUNITY): Payer: Self-pay

## 2022-07-07 ENCOUNTER — Other Ambulatory Visit: Payer: Self-pay | Admitting: Nurse Practitioner

## 2022-07-10 ENCOUNTER — Other Ambulatory Visit: Payer: Self-pay | Admitting: Nurse Practitioner

## 2022-07-10 MED ORDER — WEGOVY 2.4 MG/0.75ML ~~LOC~~ SOAJ
2.4000 mg | SUBCUTANEOUS | 0 refills | Status: DC
Start: 2022-07-10 — End: 2022-08-02
  Filled 2022-07-10: qty 3, 28d supply, fill #0

## 2022-07-11 ENCOUNTER — Other Ambulatory Visit (HOSPITAL_COMMUNITY): Payer: Self-pay

## 2022-08-02 ENCOUNTER — Other Ambulatory Visit: Payer: Self-pay | Admitting: Nurse Practitioner

## 2022-08-04 ENCOUNTER — Other Ambulatory Visit (HOSPITAL_COMMUNITY): Payer: Self-pay

## 2022-08-04 MED ORDER — WEGOVY 2.4 MG/0.75ML ~~LOC~~ SOAJ
2.4000 mg | SUBCUTANEOUS | 0 refills | Status: DC
  Filled 2022-08-04: qty 3, 28d supply, fill #0

## 2022-08-05 ENCOUNTER — Other Ambulatory Visit: Payer: Self-pay | Admitting: Nurse Practitioner

## 2022-08-05 ENCOUNTER — Other Ambulatory Visit (HOSPITAL_COMMUNITY): Payer: Self-pay

## 2022-08-05 DIAGNOSIS — F339 Major depressive disorder, recurrent, unspecified: Secondary | ICD-10-CM

## 2022-08-05 MED ORDER — ESCITALOPRAM OXALATE 5 MG PO TABS
5.0000 mg | ORAL_TABLET | Freq: Every day | ORAL | 0 refills | Status: DC
  Filled 2022-08-05: qty 30, 30d supply, fill #0

## 2022-08-05 NOTE — Telephone Encounter (Signed)
I sent in a month's supply, please have patient schedule a tele visit. Thank you.

## 2022-08-05 NOTE — Telephone Encounter (Signed)
Lmtcb.

## 2022-08-05 NOTE — Telephone Encounter (Signed)
Please schedule patient a phone visit so we can go over PHQ and GAD before medication can be prescribed. Thank you

## 2022-08-05 NOTE — Telephone Encounter (Signed)
Patient aware and verbalized understanding. Appt made 

## 2022-08-06 ENCOUNTER — Other Ambulatory Visit (HOSPITAL_COMMUNITY): Payer: Self-pay

## 2022-08-25 ENCOUNTER — Ambulatory Visit (INDEPENDENT_AMBULATORY_CARE_PROVIDER_SITE_OTHER): Payer: 59 | Admitting: Nurse Practitioner

## 2022-08-25 DIAGNOSIS — F339 Major depressive disorder, recurrent, unspecified: Secondary | ICD-10-CM | POA: Diagnosis not present

## 2022-08-25 DIAGNOSIS — F419 Anxiety disorder, unspecified: Secondary | ICD-10-CM | POA: Diagnosis not present

## 2022-08-25 MED ORDER — HYDROXYZINE HCL 10 MG PO TABS: 10.0000 mg | ORAL_TABLET | Freq: Three times a day (TID) | ORAL | 1 refills | Status: DC | PRN

## 2022-08-25 MED ORDER — ESCITALOPRAM OXALATE 10 MG PO TABS: 10.0000 mg | ORAL_TABLET | Freq: Every day | ORAL | 3 refills | Status: DC

## 2022-08-25 NOTE — Progress Notes (Signed)
Virtual Visit  Note Due to COVID-19 pandemic this visit was conducted virtually. This visit type was conducted due to national recommendations for restrictions regarding the COVID-19 Pandemic (e.g. social distancing, sheltering in place) in an effort to limit this patient's exposure and mitigate transmission in our community. All issues noted in this document were discussed and addressed.  A physical exam was not performed with this format.  I connected with Catherine Davis on 08/25/22 at 10:30 AM by telephone and verified that I am speaking with the correct person using two identifiers. Catherine Davis is currently located at home during visit. The provider, Daryll Drown, NP is located in their office at time of visit.  I discussed the limitations, risks, security and privacy concerns of performing an evaluation and management service by telephone and the availability of in person appointments. I also discussed with the patient that there may be a patient responsible charge related to this service. The patient expressed understanding and agreed to proceed.   History and Present Illness:  HPI  Anxiety, Follow-up  She was last seen for anxiety 3 months ago. Changes made at last visit include Lexapro 5 mg tablet by mouth daily..   She reports good compliance with treatment. She reports good tolerance of treatment. She is not having side effects.   She feels her anxiety is moderate and Worse since last visit.  Symptoms: No chest pain No difficulty concentrating  No dizziness No fatigue  Yes feelings of losing control Yes insomnia  Yes irritable No palpitations  No panic attacks No racing thoughts  No shortness of breath No sweating  No tremors/shakes    GAD-7 Results    08/25/2022   11:04 AM 05/16/2022    2:16 PM 02/21/2022    2:13 PM  GAD-7 Generalized Anxiety Disorder Screening Tool  1. Feeling Nervous, Anxious, or on Edge 3 1 0  2. Not Being Able to Stop or Control Worrying 3  1 0  3. Worrying Too Much About Different Things 3 0 0  4. Trouble Relaxing 2 0 0  5. Being So Restless it's Hard To Sit Still 0 0 0  6. Becoming Easily Annoyed or Irritable 3 0 0  7. Feeling Afraid As If Something Awful Might Happen 2 0 0  Total GAD-7 Score 16 2 0  Difficulty At Work, Home, or Getting  Along With Others? Somewhat difficult Not difficult at all Not difficult at all    PHQ-9 Scores    08/25/2022   11:01 AM 05/16/2022    2:16 PM 02/21/2022    2:13 PM  PHQ9 SCORE ONLY  PHQ-9 Total Score 5 2 0   Depression, Follow-up  She  was last seen for this 3 months ago. Changes made at last visit include Lexapro 10 mg tablet by mouth daily..   She reports good compliance with treatment. She is not having side effects.   She reports good tolerance of treatment. Current symptoms include: depressed mood She feels she is Worse since last visit.     08/25/2022   11:01 AM 05/16/2022    2:16 PM 02/21/2022    2:13 PM  Depression screen PHQ 2/9  Decreased Interest 1 1 0  Down, Depressed, Hopeless 0 0 0  PHQ - 2 Score 1 1 0  Altered sleeping 1 1 0  Tired, decreased energy 2 0 0  Change in appetite 0 0 0  Feeling bad or failure about yourself  1 0 0  Trouble concentrating 0 0 0  Moving slowly or fidgety/restless 0 0 0  Suicidal thoughts 0 0 0  PHQ-9 Score 5 2 0  Difficult doing work/chores Somewhat difficult Not difficult at all Not difficult at all      ROS   Observations/Objective: Televisit patient not in distress.  Assessment and Plan: Anxiety Anxiety not well controlled completed GAD-7.  Increase Lexapro to 10 mg tablet by mouth.  Started patient on hydroxyzine 10 mg tablet by mouth 3 times daily as needed.  Follow-up in 6 to 12 weeks.  Depression, recurrent ([facility]) Depression is well controlled completed PHQ-9.  Increase Lexapro from 5 mg tablet by mouth to 10 mg tablet by mouth daily.  Follow-up in 6 to 12 weeks.   Follow Up Instructions: Follow-up in 6 to 12  weeks.    I discussed the assessment and treatment plan with the patient. The patient was provided an opportunity to ask questions and all were answered. The patient agreed with the plan and demonstrated an understanding of the instructions.   The patient was advised to call back or seek an in-person evaluation if the symptoms worsen or if the condition fails to improve as anticipated.  The above assessment and management plan was discussed with the patient. The patient verbalized understanding of and has agreed to the management plan. Patient is aware to call the clinic if symptoms persist or worsen. Patient is aware when to return to the clinic for a follow-up visit. Patient educated on when it is appropriate to go to the emergency department.   Time call ended: 10:43 AM  I provided 13 minutes of  non face-to-face time during this encounter.    Daryll Drown, NP

## 2022-08-25 NOTE — Patient Instructions (Signed)
Generalized Anxiety Disorder, Adult Generalized anxiety disorder (GAD) is a mental health condition. Unlike normal worries, anxiety related to GAD is not triggered by a specific event. These worries do not fade or get better with time. GAD interferes with relationships, work, and school. GAD symptoms can vary from mild to severe. People with severe GAD can have intense waves of anxiety with physical symptoms that are similar to panic attacks. What are the causes? The exact cause of GAD is not known, but the following are believed to have an impact: Differences in natural brain chemicals. Genes passed down from parents to children. Differences in the way threats are perceived. Development and stress during childhood. Personality. What increases the risk? The following factors may make you more likely to develop this condition: Being female. Having a family history of anxiety disorders. Being very shy. Experiencing very stressful life events, such as the death of a loved one. Having a very stressful family environment. What are the signs or symptoms? People with GAD often worry excessively about many things in their lives, such as their health and family. Symptoms may also include: Mental and emotional symptoms: Worrying excessively about natural disasters. Fear of being late. Difficulty concentrating. Fears that others are judging your performance. Physical symptoms: Fatigue. Headaches, muscle tension, muscle twitches, trembling, or feeling shaky. Feeling like your heart is pounding or beating very fast. Feeling out of breath or like you cannot take a deep breath. Having trouble falling asleep or staying asleep, or experiencing restlessness. Sweating. Nausea, diarrhea, or irritable bowel syndrome (IBS). Behavioral symptoms: Experiencing erratic moods or irritability. Avoidance of new situations. Avoidance of people. Extreme difficulty making decisions. How is this diagnosed? This  condition is diagnosed based on your symptoms and medical history. You will also have a physical exam. Your health care provider may perform tests to rule out other possible causes of your symptoms. To be diagnosed with GAD, a person must have anxiety that: Is out of his or her control. Affects several different aspects of his or her life, such as work and relationships. Causes distress that makes him or her unable to take part in normal activities. Includes at least three symptoms of GAD, such as restlessness, fatigue, trouble concentrating, irritability, muscle tension, or sleep problems. Before your health care provider can confirm a diagnosis of GAD, these symptoms must be present more days than they are not, and they must last for 6 months or longer. How is this treated? This condition may be treated with: Medicine. Antidepressant medicine is usually prescribed for Certain-term daily control. Anti-anxiety medicines may be added in severe cases, especially when panic attacks occur. Talk therapy (psychotherapy). Certain types of talk therapy can be helpful in treating GAD by providing support, education, and guidance. Options include: Cognitive behavioral therapy (CBT). People learn coping skills and self-calming techniques to ease their physical symptoms. They learn to identify unrealistic thoughts and behaviors and to replace them with more appropriate thoughts and behaviors. Acceptance and commitment therapy (ACT). This treatment teaches people how to be mindful as a way to cope with unwanted thoughts and feelings. Biofeedback. This process trains you to manage your body's response (physiological response) through breathing techniques and relaxation methods. You will work with a therapist while machines are used to monitor your physical symptoms. Stress management techniques. These include yoga, meditation, and exercise. A mental health specialist can help determine which treatment is best for you.  Some people see improvement with one type of therapy. However, other people require   a combination of therapies. Follow these instructions at home: Lifestyle Maintain a consistent routine and schedule. Anticipate stressful situations. Create a plan and allow extra time to work with your plan. Practice stress management or self-calming techniques that you have learned from your therapist or your health care provider. Exercise regularly and spend time outdoors. Eat a healthy diet that includes plenty of vegetables, fruits, whole grains, low-fat dairy products, and lean protein. Do not eat a lot of foods that are high in fat, added sugar, or salt (sodium). Drink plenty of water. Avoid alcohol. Alcohol can increase anxiety. Avoid caffeine and certain over-the-counter cold medicines. These may make you feel worse. Ask your pharmacist which medicines to avoid. General instructions Take over-the-counter and prescription medicines only as told by your health care provider. Understand that you are likely to have setbacks. Accept this and be kind to yourself as you persist to take better care of yourself. Anticipate stressful situations. Create a plan and allow extra time to work with your plan. Recognize and accept your accomplishments, even if you judge them as small. Spend time with people who care about you. Keep all follow-up visits. This is important. Where to find more information National Institute of Mental Health: www.nimh.nih.gov Substance Abuse and Mental Health Services: www.samhsa.gov Contact a health care provider if: Your symptoms do not get better. Your symptoms get worse. You have signs of depression, such as: A persistently sad or irritable mood. Loss of enjoyment in activities that used to bring you joy. Change in weight or eating. Changes in sleeping habits. Get help right away if: You have thoughts about hurting yourself or others. If you ever feel like you may hurt  yourself or others, or have thoughts about taking your own life, get help right away. Go to your nearest emergency department or: Call your local emergency services (911 in the [place] Call a suicide crisis helpline, such as the National Suicide Prevention Lifeline at 1-800-273-8255 or 988 in the [place] This is open 24 hours a day in the [place] Text the Crisis Text Line at 741741 (in the [place] Summary Generalized anxiety disorder (GAD) is a mental health condition that involves worry that is not triggered by a specific event. People with GAD often worry excessively about many things in their lives, such as their health and family. GAD may cause symptoms such as restlessness, trouble concentrating, sleep problems, frequent sweating, nausea, diarrhea, headaches, and trembling or muscle twitching. A mental health specialist can help determine which treatment is best for you. Some people see improvement with one type of therapy. However, other people require a combination of therapies. This information is not intended to replace advice given to you by your health care provider. Make sure you discuss any questions you have with your health care provider. Document Revised: 04/17/2021 Document Reviewed: 01/13/2021 Elsevier Patient Education  2023 Else[employer] Inc[employer] Major Depressive Disorder, Adult Major depressive disorder (MDD) is a mental health condition. People with this disorder feel very sad, hopeless, and lose interest in things. Symptoms last most of the day, almost every day, for 2 weeks. MDD can affect: Relationships. Work and school. Things you usually like to do. What are the causes? The cause of MDD is not known. What increases the risk? Having family members with depression. Being female. Family problems. Alcohol or drug misuse. A lot of stress in your life, such as from: Living without basic needs such as food and housing. Being treated poorly because of race, sex, or religion  (  discrimination). Things that caused you pain as a child, especially if you lost a parent or were abused. Health and mental problems that you have had for a long time. What are the signs or symptoms? The main symptoms of this condition are: Being sad all the time. Being grouchy (irritable) all the time. Not enjoying the things you usually like. Sleeping too much or too little. Eating too much or too little. Feeling tired. Other symptoms include: Gaining or losing weight, without knowing why. Being restless and weak. Feeling hopeless, worthless, or guilty. Trouble thinking or making decisions. Thoughts of hurting yourself or others, or thoughts of ending your life. Spending a lot of time alone. Being unable to do daily tasks. If you have very bad MDD, you may: Believe things that are not true. Hear, see, taste, or feel things that are not there. Have mild depression that lasts for at least 2 years. Feel very sad and hopeless. Have trouble speaking or moving. Feel very sad during some seasons. How is this treated? Talk therapy. This teaches you about thoughts, feelings, and actions and how to change them. This can also help you to talk with others. This can be done with members of your family. Medicines. Lifestyle changes. You may need to: Limit alcohol use. Stop using drugs, if you use them. Exercise. Get plenty of sleep. Eat healthy. Spend more time outdoors. Brain stimulation. This may be done when symptoms are very bad or have not gotten better. Follow these instructions at home: Alcohol use Do not drink alcohol if: Your health care provider tells you not to drink. You are pregnant, may be pregnant, or are planning to become pregnant. If you drink alcohol: Limit how much you use to: 0-1 drink a day for women. 0-2 drinks a day for men. Know how much alcohol is in your drink. In the [place] one drink equals one 12 oz bottle of beer (355 mL), one 5 oz glass of wine (148  mL), or one 1 oz glass of hard liquor (44 mL). Activity Exercise as told by your doctor. Spend time outdoors. Make time to do the things you enjoy. Find ways to deal with stress. Try to: Meditate. Do deep breathing. Spend time in nature. Keep a journal. Return to your normal activities when your doctor says that it is safe. General instructions  Take over-the-counter and prescription medicines only as told by your doctor. Talk to your doctor about: Alcohol use. It can affect medicines. Any drug use. Eat healthy foods. Get a lot of sleep. Think about joining a support group. Ask your doctor about that. Keep all follow-up visits. Your doctor will need to check on your mood, behavior, and medicines, and change your treatment as needed. Where to find more information: [employer] on Mental Illness: nami.[employer] of Mental Health: https://www.frey.org/ American Psychiatric Association: psychiatry.org Contact a doctor if: You feel worse. You get new symptoms. Get help right away if: You hurt yourself on purpose (self-harm). You have thoughts about hurting yourself or others. You see, hear, taste, smell, or feel things that are not there. Get help right away if you feel like you may hurt yourself or others, or have thoughts about taking your own life. Go to your nearest emergency room or: Call 911. Call the [facility] at 915-243-3573 or 988. This is open 24 hours a day. Text the Crisis Text Line at 959-249-5690. This information is not intended to replace advice given to you by your  health care provider. Make sure you discuss any questions you have with your health care provider. Document Revised: 01/28/2022 Document Reviewed: 01/28/2022 Elsevier Patient Education  [place].

## 2022-08-25 NOTE — Assessment & Plan Note (Signed)
Depression is well controlled completed PHQ-9.  Increase Lexapro from 5 mg tablet by mouth to 10 mg tablet by mouth daily.  Follow-up in 6 to 12 weeks.

## 2022-08-25 NOTE — Assessment & Plan Note (Addendum)
Anxiety not well controlled completed GAD-7.  Increase Lexapro to 10 mg tablet by mouth.  Started patient on hydroxyzine 10 mg tablet by mouth 3 times daily as needed.  Follow-up in 6 to 12 weeks.

## 2022-08-26 ENCOUNTER — Other Ambulatory Visit (HOSPITAL_COMMUNITY): Payer: Self-pay

## 2022-08-31 ENCOUNTER — Other Ambulatory Visit: Payer: Self-pay | Admitting: Nurse Practitioner

## 2022-09-01 ENCOUNTER — Other Ambulatory Visit (HOSPITAL_COMMUNITY): Payer: Self-pay

## 2022-09-01 MED ORDER — WEGOVY 2.4 MG/0.75ML ~~LOC~~ SOAJ
2.4000 mg | SUBCUTANEOUS | 0 refills | Status: DC
  Filled 2022-09-01: qty 3, 28d supply, fill #0

## 2022-09-08 ENCOUNTER — Telehealth: Payer: 59 | Admitting: Physician Assistant

## 2022-09-08 DIAGNOSIS — J069 Acute upper respiratory infection, unspecified: Secondary | ICD-10-CM | POA: Diagnosis not present

## 2022-09-08 DIAGNOSIS — R6889 Other general symptoms and signs: Secondary | ICD-10-CM | POA: Diagnosis not present

## 2022-09-08 MED ORDER — BENZONATATE 100 MG PO CAPS: 100.0000 mg | ORAL_CAPSULE | Freq: Three times a day (TID) | ORAL | 0 refills | Status: DC | PRN

## 2022-09-08 MED ORDER — FLUTICASONE PROPIONATE 50 MCG/ACT NA SUSP: 2.0000 | Freq: Every day | NASAL | 0 refills | Status: DC

## 2022-09-08 NOTE — Progress Notes (Signed)
E-Visit for Upper Respiratory Infection   We are sorry you are not feeling well.  Here is how we plan to help!  Based on what you have shared with me, it looks like you may have a viral upper respiratory infection.  Upper respiratory infections are caused by a large number of viruses; however, rhinovirus is the most common cause. It is possible this could be influenza (a virus) but you are out of treatment window for Tamiflu.   Symptoms vary from person to person, with common symptoms including sore throat, cough, fatigue or lack of energy and feeling of general discomfort.  A low-grade fever of up to 100.4 may present, but is often uncommon.  Symptoms vary however, and are closely related to a person's age or underlying illnesses.  The most common symptoms associated with an upper respiratory infection are nasal discharge or congestion, cough, sneezing, headache and pressure in the ears and face.  These symptoms usually persist for about 3 to 10 days, but can last up to 2 weeks.  It is important to know that upper respiratory infections do not cause serious illness or complications in most cases.    Upper respiratory infections can be transmitted from person to person, with the most common method of transmission being a person's hands.  The virus is able to live on the skin and can infect other persons for up to 2 hours after direct contact.  Also, these can be transmitted when someone coughs or sneezes; thus, it is important to cover the mouth to reduce this risk.  To keep the spread of the illness at bay, good hand hygiene is very important.  This is an infection that is most likely caused by a virus. There are no specific treatments other than to help you with the symptoms until the infection runs its course.  We are sorry you are not feeling well.  Here is how we plan to help!   For nasal congestion, you may use an oral decongestants such as Mucinex D or if you have glaucoma or high blood pressure  use plain Mucinex.  Saline nasal spray or nasal drops can help and can safely be used as often as needed for congestion.  For your congestion, I have prescribed Fluticasone nasal spray one spray in each nostril twice a day  If you do not have a history of heart disease, hypertension, diabetes or thyroid disease, prostate/bladder issues or glaucoma, you may also use Sudafed to treat nasal congestion.  It is highly recommended that you consult with a pharmacist or your primary care physician to ensure this medication is safe for you to take.     If you have a cough, you may use cough suppressants such as Delsym and Robitussin.  If you have glaucoma or high blood pressure, you can also use Coricidin HBP.   For cough I have prescribed for you A prescription cough medication called Tessalon Perles 100 mg. You may take 1-2 capsules every 8 hours as needed for cough  If you have a sore or scratchy throat, use a saltwater gargle-  to  teaspoon of salt dissolved in a 4-ounce to 8-ounce glass of warm water.  Gargle the solution for approximately 15-30 seconds and then spit.  It is important not to swallow the solution.  You can also use throat lozenges/cough drops and Chloraseptic spray to help with throat pain or discomfort.  Warm or cold liquids can also be helpful in relieving throat pain.  For  headache, pain or general discomfort, you can use Ibuprofen or Tylenol as directed.   Some authorities believe that zinc sprays or the use of Echinacea may shorten the course of your symptoms.   HOME CARE Only take medications as instructed by your medical team. Be sure to drink plenty of fluids. Water is fine as well as fruit juices, sodas and electrolyte beverages. You may want to stay away from caffeine or alcohol. If you are nauseated, try taking small sips of liquids. How do you know if you are getting enough fluid? Your urine should be a pale yellow or almost colorless. Get rest. Taking a steamy shower or  using a humidifier may help nasal congestion and ease sore throat pain. You can place a towel over your head and breathe in the steam from hot water coming from a faucet. Using a saline nasal spray works much the same way. Cough drops, hard candies and sore throat lozenges may ease your cough. Avoid close contacts especially the very young and the elderly Cover your mouth if you cough or sneeze Always remember to wash your hands.   GET HELP RIGHT AWAY IF: You develop worsening fever. If your symptoms do not improve within 10 days You develop yellow or green discharge from your nose over 3 days. You have coughing fits You develop a severe head ache or visual changes. You develop shortness of breath, difficulty breathing or start having chest pain Your symptoms persist after you have completed your treatment plan  MAKE SURE YOU  Understand these instructions. Will watch your condition. Will get help right away if you are not doing well or get worse.  Thank you for choosing an e-visit.  Your e-visit answers were reviewed by a board certified advanced clinical practitioner to complete your personal care plan. Depending upon the condition, your plan could have included both over the counter or prescription medications.  Please review your pharmacy choice. Make sure the pharmacy is open so you can pick up prescription now. If there is a problem, you may contact your provider through [employer] and have the prescription routed to another pharmacy.  Your safety is important to [place]. If you have drug allergies check your prescription carefully.   For the next 24 hours you can use MyChart to ask questions about today's visit, request a non-urgent call back, or ask for a work or school excuse. You will get an email in the next two days asking about your experience. I hope that your e-visit has been valuable and will speed your recovery.    I have spent 5 minutes in review of e-visit  questionnaire, review and updating patient chart, medical decision making and response to patient.   Margaretann Loveless, PA-C

## 2022-09-16 ENCOUNTER — Other Ambulatory Visit: Payer: Self-pay | Admitting: Nurse Practitioner

## 2022-09-16 DIAGNOSIS — F339 Major depressive disorder, recurrent, unspecified: Secondary | ICD-10-CM

## 2022-09-24 ENCOUNTER — Other Ambulatory Visit (HOSPITAL_COMMUNITY): Payer: Self-pay

## 2022-09-24 ENCOUNTER — Other Ambulatory Visit: Payer: Self-pay | Admitting: Nurse Practitioner

## 2022-09-24 MED ORDER — WEGOVY 2.4 MG/0.75ML ~~LOC~~ SOAJ
2.4000 mg | SUBCUTANEOUS | 0 refills | Status: DC
  Filled 2022-09-24: qty 3, 28d supply, fill #0

## 2022-09-25 ENCOUNTER — Other Ambulatory Visit (HOSPITAL_COMMUNITY): Payer: Self-pay

## 2022-10-01 ENCOUNTER — Other Ambulatory Visit (HOSPITAL_COMMUNITY): Payer: Self-pay

## 2022-10-03 ENCOUNTER — Encounter: Payer: Self-pay | Admitting: Nurse Practitioner

## 2022-10-07 ENCOUNTER — Other Ambulatory Visit (HOSPITAL_COMMUNITY): Payer: Self-pay

## 2022-10-07 MED ORDER — HYDROXYZINE HCL 10 MG PO TABS
10.0000 mg | ORAL_TABLET | Freq: Three times a day (TID) | ORAL | 0 refills | Status: DC | PRN
  Filled 2022-10-07 – 2022-10-22 (×2): qty 30, 10d supply, fill #0

## 2022-10-08 ENCOUNTER — Other Ambulatory Visit (HOSPITAL_COMMUNITY): Payer: Self-pay

## 2022-10-08 MED ORDER — ESCITALOPRAM OXALATE 10 MG PO TABS
10.0000 mg | ORAL_TABLET | Freq: Every day | ORAL | 2 refills | Status: DC
  Filled 2022-10-08 – 2022-10-22 (×2): qty 30, 30d supply, fill #0
  Filled 2022-11-07 – 2022-12-06 (×2): qty 30, 30d supply, fill #1

## 2022-10-14 ENCOUNTER — Other Ambulatory Visit (HOSPITAL_COMMUNITY): Payer: Self-pay

## 2022-10-15 ENCOUNTER — Telehealth: Payer: Self-pay | Admitting: *Deleted

## 2022-10-15 ENCOUNTER — Other Ambulatory Visit (HOSPITAL_COMMUNITY): Payer: Self-pay

## 2022-10-15 ENCOUNTER — Other Ambulatory Visit: Payer: Self-pay | Admitting: Nurse Practitioner

## 2022-10-15 MED ORDER — WEGOVY 2.4 MG/0.75ML ~~LOC~~ SOAJ: 2.4000 mg | SUBCUTANEOUS | 0 refills | Status: DC

## 2022-10-15 NOTE — Telephone Encounter (Signed)
Catherine Davis  (Key: [id number]) Rx #: [id number] YWVPXT 2.4MG /0.75ML auto-injectors  Sent to Plan

## 2022-10-15 NOTE — Telephone Encounter (Signed)
Pt wants update on PA today if possible. She has been waiting 2 weeks and has not heard anything about the PA and pharmacy says it has not been completed.

## 2022-10-16 ENCOUNTER — Other Ambulatory Visit: Payer: Self-pay | Admitting: Nurse Practitioner

## 2022-10-16 NOTE — Telephone Encounter (Signed)
Catherine Davis (Key: [id number]) Rx #: [id number] GNOIBB 2.4MG /0.75ML auto-injectors Form [employer] ePA Form 2017 NCPDP Created 23 hours ago Sent to Plan 23 hours ago Plan Response 23 hours ago Submit Clinical Questions 23 hours ago Determination Favorable 5 hours ago Your prior authorization for Mancel Parsons has been approved! MORE INFO Personalized support and financial assistance may be available through the [employer] program. For more information, and to see program requirements, click on the More Info button to the right.  Message from plan: The request has been approved. The authorization is effective from 10/16/2022 to 10/16/2023, as long as the member is enrolled in their current health plan. The request was reviewed and approved by a licensed clinical pharmacist. This request has been approved for 61mL per 28 days.The following prior authorizations have also been entered effective 10/16/2022 through 10/16/2023: Wegovy 0.25mg /0.76mL, allowing 64mL per 28 days; please reference authorization number [id number]. Wegovy 0.5mg /0.42mL, allowing 66mL per 28 days; please reference authorization number [id number]. Wegovy 1mg /0.22mL, allowing 24mL per 28 days; please reference authorization number [id number]. Wegovy 1.7mg /0.57mL, allowing 41mL per 28 days; please reference authorization number [id number]. A written notification letter will follow with additional details.  Pharmacy informed.

## 2022-10-17 ENCOUNTER — Other Ambulatory Visit: Payer: Self-pay | Admitting: Nurse Practitioner

## 2022-10-17 ENCOUNTER — Other Ambulatory Visit (HOSPITAL_COMMUNITY): Payer: Self-pay

## 2022-10-17 MED ORDER — WEGOVY 2.4 MG/0.75ML ~~LOC~~ SOAJ
2.4000 mg | SUBCUTANEOUS | 0 refills | Status: DC
  Filled 2022-10-17 – 2022-10-22 (×2): qty 3, 28d supply, fill #0

## 2022-10-17 NOTE — Telephone Encounter (Signed)
Looks like I had ordered medication 2 days ago and sent it to the right pharmacy [place]

## 2022-10-20 ENCOUNTER — Other Ambulatory Visit (HOSPITAL_COMMUNITY): Payer: Self-pay

## 2022-10-22 ENCOUNTER — Other Ambulatory Visit (HOSPITAL_COMMUNITY): Payer: Self-pay

## 2022-10-30 ENCOUNTER — Other Ambulatory Visit (HOSPITAL_COMMUNITY): Payer: Self-pay

## 2022-10-30 ENCOUNTER — Telehealth: Payer: Commercial Managed Care - PPO | Admitting: Physician Assistant

## 2022-10-30 DIAGNOSIS — R3989 Other symptoms and signs involving the genitourinary system: Secondary | ICD-10-CM

## 2022-10-31 ENCOUNTER — Other Ambulatory Visit (HOSPITAL_COMMUNITY): Payer: Self-pay

## 2022-10-31 MED ORDER — CEPHALEXIN 500 MG PO CAPS
500.0000 mg | ORAL_CAPSULE | Freq: Two times a day (BID) | ORAL | 0 refills | Status: DC
  Filled 2022-10-31: qty 14, 7d supply, fill #0

## 2022-10-31 NOTE — Progress Notes (Signed)

## 2022-11-07 ENCOUNTER — Other Ambulatory Visit: Payer: Self-pay

## 2022-11-10 ENCOUNTER — Other Ambulatory Visit (HOSPITAL_COMMUNITY): Payer: Self-pay

## 2022-11-13 ENCOUNTER — Other Ambulatory Visit (HOSPITAL_COMMUNITY): Payer: Self-pay

## 2022-11-26 ENCOUNTER — Other Ambulatory Visit (HOSPITAL_COMMUNITY): Payer: Self-pay

## 2022-11-26 ENCOUNTER — Other Ambulatory Visit: Payer: Self-pay | Admitting: Nurse Practitioner

## 2022-11-26 MED ORDER — WEGOVY 2.4 MG/0.75ML ~~LOC~~ SOAJ
2.4000 mg | SUBCUTANEOUS | 0 refills | Status: DC
  Filled 2022-11-26 – 2022-12-08 (×2): qty 3, 28d supply, fill #0

## 2022-11-26 MED ORDER — HYDROXYZINE HCL 10 MG PO TABS
10.0000 mg | ORAL_TABLET | Freq: Three times a day (TID) | ORAL | 0 refills | Status: DC | PRN
  Filled 2022-11-26: qty 30, 10d supply, fill #0

## 2022-11-27 ENCOUNTER — Other Ambulatory Visit: Payer: Self-pay

## 2022-11-27 ENCOUNTER — Other Ambulatory Visit (HOSPITAL_COMMUNITY): Payer: Self-pay

## 2022-11-28 ENCOUNTER — Ambulatory Visit: Payer: Commercial Managed Care - PPO | Admitting: Family Medicine

## 2022-12-01 ENCOUNTER — Other Ambulatory Visit (HOSPITAL_COMMUNITY): Payer: Self-pay

## 2022-12-08 ENCOUNTER — Other Ambulatory Visit (HOSPITAL_COMMUNITY): Payer: Self-pay

## 2022-12-11 ENCOUNTER — Other Ambulatory Visit (HOSPITAL_COMMUNITY): Payer: Self-pay

## 2022-12-11 ENCOUNTER — Ambulatory Visit (INDEPENDENT_AMBULATORY_CARE_PROVIDER_SITE_OTHER): Payer: Commercial Managed Care - PPO | Admitting: Family Medicine

## 2022-12-11 ENCOUNTER — Encounter: Payer: Self-pay | Admitting: Family Medicine

## 2022-12-11 DIAGNOSIS — E559 Vitamin D deficiency, unspecified: Secondary | ICD-10-CM

## 2022-12-11 DIAGNOSIS — F339 Major depressive disorder, recurrent, unspecified: Secondary | ICD-10-CM

## 2022-12-11 DIAGNOSIS — F419 Anxiety disorder, unspecified: Secondary | ICD-10-CM

## 2022-12-11 MED ORDER — HYDROXYZINE HCL 10 MG PO TABS
10.0000 mg | ORAL_TABLET | Freq: Three times a day (TID) | ORAL | 3 refills | Status: DC | PRN
  Filled 2022-12-11 – 2023-02-20 (×3): qty 30, 10d supply, fill #0
  Filled 2023-03-31: qty 30, 10d supply, fill #1
  Filled 2023-07-01: qty 30, 10d supply, fill #2

## 2022-12-11 MED ORDER — ESCITALOPRAM OXALATE 10 MG PO TABS
10.0000 mg | ORAL_TABLET | Freq: Every day | ORAL | 3 refills | Status: DC
  Filled 2022-12-11 – 2023-01-04 (×2): qty 90, 90d supply, fill #0
  Filled 2023-03-31: qty 90, 90d supply, fill #1
  Filled 2023-07-01: qty 90, 90d supply, fill #2
  Filled 2023-09-28 – 2023-10-09 (×2): qty 90, 90d supply, fill #3

## 2022-12-11 MED ORDER — WEGOVY 2.4 MG/0.75ML ~~LOC~~ SOAJ
2.4000 mg | SUBCUTANEOUS | 1 refills | Status: DC
  Filled 2022-12-11: qty 3, 28d supply, fill #0
  Filled 2022-12-12: qty 9, 84d supply, fill #0
  Filled 2022-12-14 – 2022-12-15 (×2): qty 3, 28d supply, fill #0
  Filled 2022-12-20 – 2023-01-07 (×4): qty 3, 28d supply, fill #1
  Filled 2023-01-25 – 2023-01-30 (×4): qty 3, 28d supply, fill #2
  Filled ????-??-??: fill #0

## 2022-12-11 NOTE — Progress Notes (Signed)
Subjective:  Patient ID: Catherine Davis, female    DOB: 06/28/1986, 37 y.o.   MRN: [id number]  Patient Care Team: Baruch Gouty, FNP as PCP - General (Family Medicine)   Chief Complaint:  Establish Care (JE patient ) and Weight Loss (Other options )   HPI: Catherine Davis is a 37 y.o. female presenting on 12/11/2022 for Establish Care (JE patient ) and Weight Loss (Other options )   1. Morbid obesity ([place]) Initial weight 299 lbs, weight today 231 lbs. Has been dieting and exercising along with [facility] for weight management. Tolerates medications well, no adverse side effects.   2. Anxiety 3. Depression, recurrent ([place]) Currently on Lexapro and as needed atarax. She has been doing well on medications without associated side effects. She states she has only needed the atarax a few times since prescribed.     12/11/2022    3:19 PM 08/25/2022   11:04 AM 05/16/2022    2:16 PM 02/21/2022    2:13 PM  GAD 7 : Generalized Anxiety Score  Nervous, Anxious, on Edge '1 3 1 '$ 0  Control/stop worrying '1 3 1 '$ 0  Worry too much - different things 1 3 0 0  Trouble relaxing 0 2 0 0  Restless 0 0 0 0  Easily annoyed or irritable 1 3 0 0  Afraid - awful might happen 0 2 0 0  Total GAD 7 Score '4 16 2 '$ 0  Anxiety Difficulty Not difficult at all Somewhat difficult Not difficult at all Not difficult at all       12/11/2022    3:19 PM 08/25/2022   11:01 AM 05/16/2022    2:16 PM 02/21/2022    2:13 PM 04/22/2017   10:35 AM  Depression screen PHQ 2/9  Decreased Interest '1 1 1 '$ 0 0  Down, Depressed, Hopeless 0 0 0 0 0  PHQ - 2 Score '1 1 1 '$ 0 0  Altered sleeping 0 1 1 0   Tired, decreased energy 0 2 0 0   Change in appetite 0 0 0 0   Feeling bad or failure about yourself  0 1 0 0   Trouble concentrating 0 0 0 0   Moving slowly or fidgety/restless 0 0 0 0   Suicidal thoughts 0 0 0 0   PHQ-9 Score '1 5 2 '$ 0   Difficult doing work/chores Not difficult at all Somewhat difficult Not difficult at all Not difficult  at all      4. Vitamin D deficiency Intermittently taking repletion therapy. Denies arthralgias, trouble walking, or recent fractures.      Relevant past medical, surgical, family, and social history reviewed and updated as indicated.  Allergies and medications reviewed and updated. Data reviewed: Chart in Epic.   History reviewed. No pertinent past medical history.  Past Surgical History:  Procedure Laterality Date   ECTOPIC PREGNANCY SURGERY     LAPAROSCOPIC TUBAL LIGATION Bilateral 06/29/2019   Procedure: LAPAROSCOPIC BILATERAL TUBAL LIGATION USING ELECTROCAUTERY;  Surgeon: Florian Buff, MD;  Location: [facility];  Service: Gynecology;  Laterality: Bilateral;    Social History   Socioeconomic History   Marital status: Single    Spouse name: Not on file   Number of children: 3   Years of education: Not on file   Highest education level: Not on file  Occupational History   Not on file  Tobacco Use   Smoking status: Some Days    Packs/day: 0.50  Years: 3.00    Total pack years: 1.50    Types: Cigarettes   Smokeless tobacco: Never  Vaping Use   Vaping Use: Never used  Substance and Sexual Activity   Alcohol use: No   Drug use: Yes    Types: Marijuana    Comment: every day   Sexual activity: Yes    Birth control/protection: Pill  Other Topics Concern   Not on file  Social History Narrative   Not on file   Social Determinants of Health   Financial Resource Strain: Not on file  Food Insecurity: Not on file  Transportation Needs: Not on file  Physical Activity: Not on file  Stress: Not on file  Social Connections: Not on file  Intimate Partner Violence: Not on file    Outpatient Encounter Medications as of 12/11/2022  Medication Sig   [DISCONTINUED] escitalopram (LEXAPRO) 10 MG tablet Take 1 tablet (10 mg total) by mouth daily.   [DISCONTINUED] hydrOXYzine (ATARAX) 10 MG tablet Take 1 tablet (10 mg total) by mouth 3 (three) times daily as needed.    [DISCONTINUED] Semaglutide-Weight Management (WEGOVY) 2.4 MG/0.75ML SOAJ Inject 2.4 mg into the skin once a week.   escitalopram (LEXAPRO) 10 MG tablet Take 1 tablet (10 mg total) by mouth daily.   hydrOXYzine (ATARAX) 10 MG tablet Take 1 tablet (10 mg total) by mouth 3 (three) times daily as needed.   Semaglutide-Weight Management (WEGOVY) 2.4 MG/0.75ML SOAJ Inject 2.4 mg into the skin once a week.   [DISCONTINUED] cephALEXin (KEFLEX) 500 MG capsule Take 1 capsule (500 mg total) by mouth 2 (two) times daily.   [DISCONTINUED] escitalopram (LEXAPRO) 10 MG tablet Take 1 tablet (10 mg total) by mouth daily.   [DISCONTINUED] hydrOXYzine (ATARAX) 10 MG tablet Take 1 tablet (10 mg total) by mouth 3 (three) times daily as needed.   No facility-administered encounter medications on file as of 12/11/2022.    No Known Allergies  Review of Systems  Constitutional:  Positive for activity change. Negative for appetite change, chills, diaphoresis, fatigue, fever and unexpected weight change.  HENT: Negative.    Eyes: Negative.  Negative for photophobia and visual disturbance.  Respiratory:  Negative for cough, chest tightness and shortness of breath.   Cardiovascular:  Negative for chest pain, palpitations and leg swelling.  Gastrointestinal:  Negative for abdominal pain, blood in stool, constipation, diarrhea, nausea and vomiting.  Endocrine: Negative.   Genitourinary:  Negative for decreased urine volume, difficulty urinating, dysuria, frequency and urgency.  Musculoskeletal:  Negative for arthralgias and myalgias.  Skin: Negative.   Allergic/Immunologic: Negative.   Neurological:  Negative for dizziness, weakness and headaches.  Hematological: Negative.   Psychiatric/Behavioral:  Positive for agitation. Negative for behavioral problems, confusion, decreased concentration, dysphoric mood, hallucinations, self-injury, sleep disturbance and suicidal ideas. The patient is nervous/anxious. The patient is  not hyperactive.   All other systems reviewed and are negative.       Objective:  BP 100/65   Pulse 85   Temp 98.1 F (36.7 C) (Temporal)   Ht '5\' 10"'$  (1.778 m)   Wt 231 lb 9.6 oz (105.1 kg)   SpO2 99%   BMI 33.23 kg/m    Wt Readings from Last 3 Encounters:  12/11/22 231 lb 9.6 oz (105.1 kg)  05/16/22 273 lb 6.4 oz (124 kg)  02/21/22 299 lb (135.6 kg)    Physical Exam Vitals and nursing note reviewed.  Constitutional:      General: She is not in acute distress.  Appearance: Normal appearance. She is well-developed and well-groomed. She is obese. She is not ill-appearing, toxic-appearing or diaphoretic.  HENT:     Head: Normocephalic and atraumatic.     Jaw: There is normal jaw occlusion.     Right Ear: Hearing normal.     Left Ear: Hearing normal.     Nose: Nose normal.     Mouth/Throat:     Lips: Pink.     Mouth: Mucous membranes are moist.     Pharynx: Oropharynx is clear. Uvula midline.  Eyes:     General: Lids are normal.     Pupils: Pupils are equal, round, and reactive to light.  Neck:     Thyroid: No thyroid mass, thyromegaly or thyroid tenderness.     Vascular: No carotid bruit or JVD.     Trachea: Trachea and phonation normal.  Cardiovascular:     Rate and Rhythm: Normal rate and regular rhythm.     Chest Dupler: PMI is not displaced.     Pulses: Normal pulses.     Heart sounds: Normal heart sounds. No murmur heard.    No friction rub. No gallop.  Pulmonary:     Effort: Pulmonary effort is normal. No respiratory distress.     Breath sounds: Normal breath sounds. No wheezing.  Abdominal:     General: There is no abdominal bruit.     Palpations: There is no hepatomegaly or splenomegaly.  Musculoskeletal:        General: Normal range of motion.     Cervical back: Normal range of motion and neck supple.     Right lower leg: No edema.     Left lower leg: No edema.  Lymphadenopathy:     Cervical: No cervical adenopathy.  Skin:    General: Skin is  warm and dry.     Capillary Refill: Capillary refill takes less than 2 seconds.     Coloration: Skin is not cyanotic, jaundiced or pale.     Findings: No rash.  Neurological:     General: No focal deficit present.     Mental Status: She is alert and oriented to person, place, and time.     Sensory: Sensation is intact.     Motor: Motor function is intact.     Coordination: Coordination is intact.     Gait: Gait is intact.     Deep Tendon Reflexes: Reflexes are normal and symmetric.  Psychiatric:        Attention and Perception: Attention and perception normal.        Mood and Affect: Mood and affect normal.        Speech: Speech normal.        Behavior: Behavior normal. Behavior is cooperative.        Thought Content: Thought content normal.        Cognition and Memory: Cognition and memory normal.        Judgment: Judgment normal.     Results for orders placed or performed in visit on 05/28/22  VITAMIN D 25 Hydroxy (Vit-D Deficiency, Fractures)  Result Value Ref Range   Vit D, 25-Hydroxy 11.4 (L) 30.0 - 100.0 ng/mL  TSH  Result Value Ref Range   TSH 1.300 0.450 - 4.500 uIU/mL  Lipid panel  Result Value Ref Range   Cholesterol, Total 144 100 - 199 mg/dL   Triglycerides 153 (H) 0 - 149 mg/dL   HDL 27 (L) >39 mg/dL   VLDL Cholesterol Cal 27 5 -  40 mg/dL   LDL Chol Calc (NIH) 90 0 - 99 mg/dL   Chol/HDL Ratio 5.3 (H) 0.0 - 4.4 ratio  Comprehensive metabolic panel  Result Value Ref Range   Glucose 81 70 - 99 mg/dL   BUN 11 6 - 20 mg/dL   Creatinine, Ser 0.96 0.57 - 1.00 mg/dL   eGFR 80 >59 mL/min/1.73   BUN/Creatinine Ratio 11 9 - 23   Sodium 137 134 - 144 mmol/L   Potassium 4.4 3.5 - 5.2 mmol/L   Chloride 102 96 - 106 mmol/L   CO2 20 20 - 29 mmol/L   Calcium 9.3 8.7 - 10.2 mg/dL   Total Protein 7.4 6.0 - 8.5 g/dL   Albumin 4.4 3.9 - 4.9 g/dL   Globulin, Total 3.0 1.5 - 4.5 g/dL   Albumin/Globulin Ratio 1.5 1.2 - 2.2   Bilirubin Total 0.3 0.0 - 1.2 mg/dL   Alkaline  Phosphatase 92 44 - 121 IU/L   AST 16 0 - 40 IU/L   ALT 12 0 - 32 IU/L  CBC with Differential/Platelet  Result Value Ref Range   WBC 3.7 3.4 - 10.8 x10E3/uL   RBC 4.41 3.77 - 5.28 x10E6/uL   Hemoglobin 13.1 11.1 - 15.9 g/dL   Hematocrit 38.7 34.0 - 46.6 %   MCV 88 79 - 97 fL   MCH 29.7 26.6 - 33.0 pg   MCHC 33.9 31.5 - 35.7 g/dL   RDW 13.2 11.7 - 15.4 %   Platelets 286 150 - 450 x10E3/uL   Neutrophils 37 Not Estab. %   Lymphs 46 Not Estab. %   Monocytes 12 Not Estab. %   Eos 4 Not Estab. %   Basos 1 Not Estab. %   Neutrophils Absolute 1.4 1.4 - 7.0 x10E3/uL   Lymphocytes Absolute 1.7 0.7 - 3.1 x10E3/uL   Monocytes Absolute 0.4 0.1 - 0.9 x10E3/uL   EOS (ABSOLUTE) 0.1 0.0 - 0.4 x10E3/uL   Basophils Absolute 0.0 0.0 - 0.2 x10E3/uL   Immature Granulocytes 0 Not Estab. %   Immature Grans (Abs) 0.0 0.0 - 0.1 x10E3/uL       Pertinent labs & imaging results that were available during my care of the patient were reviewed by me and considered in my medical decision making.  Assessment & Plan:  Catherine Davis was seen today for establish care and weight loss.  Diagnoses and all orders for this visit:  Morbid obesity ([place]) Has done great with diet, exercise, and Wegovy. Wight reduction to date 68 lbs. Will repeat labs today and refill medications as pt is doing so well.  -     Semaglutide-Weight Management (WEGOVY) 2.4 MG/0.75ML SOAJ; Inject 2.4 mg into the skin once a week. -     CMP14+EGFR -     CBC with Differential/Platelet -     Thyroid Panel With TSH  Anxiety Depression, recurrent ([place]) Doing very well on below, will continue. Has only needed the atarax a few times.  -     escitalopram (LEXAPRO) 10 MG tablet; Take 1 tablet (10 mg total) by mouth daily. -     hydrOXYzine (ATARAX) 10 MG tablet; Take 1 tablet (10 mg total) by mouth 3 (three) times daily as needed. -     CMP14+EGFR -     CBC with Differential/Platelet -     Thyroid Panel With TSH  Vitamin D deficiency Labs pending.  Continue repletion therapy. If indicated, will change repletion dosage. Eat foods rich in Vit D including milk, orange  juice, yogurt with vitamin D added, salmon or mackerel, canned tuna fish, cereals with vitamin D added, and cod liver oil. Get out in the sun but make sure to wear at least SPF 30 sunscreen.  -     Vitamin D, 25-hydroxy     Continue all other maintenance medications.  Follow up plan: Return if symptoms worsen or fail to improve.   Continue healthy lifestyle choices, including diet (rich in fruits, vegetables, and lean proteins, and low in salt and simple carbohydrates) and exercise (at least 30 minutes of moderate physical activity daily).  Educational handout given for calorie counting for weight management  The above assessment and management plan was discussed with the patient. The patient verbalized understanding of and has agreed to the management plan. Patient is aware to call the clinic if they develop any new symptoms or if symptoms persist or worsen. Patient is aware when to return to the clinic for a follow-up visit. Patient educated on when it is appropriate to go to the emergency department.   Monia Pouch, FNP-C [facility] [phone]

## 2022-12-12 ENCOUNTER — Other Ambulatory Visit (HOSPITAL_COMMUNITY): Payer: Self-pay

## 2022-12-12 ENCOUNTER — Other Ambulatory Visit: Payer: Self-pay

## 2022-12-12 LAB — CMP14+EGFR
ALT: 9 IU/L (ref 0–32)
AST: 15 IU/L (ref 0–40)
Albumin/Globulin Ratio: 1.6 (ref 1.2–2.2)
Albumin: 4.5 g/dL (ref 3.9–4.9)
Alkaline Phosphatase: 86 IU/L (ref 44–121)
BUN/Creatinine Ratio: 17 (ref 9–23)
BUN: 14 mg/dL (ref 6–20)
Bilirubin Total: 0.3 mg/dL (ref 0.0–1.2)
CO2: 19 mmol/L — ABNORMAL LOW (ref 20–29)
Calcium: 9.3 mg/dL (ref 8.7–10.2)
Chloride: 102 mmol/L (ref 96–106)
Creatinine, Ser: 0.82 mg/dL (ref 0.57–1.00)
Globulin, Total: 2.8 g/dL (ref 1.5–4.5)
Glucose: 81 mg/dL (ref 70–99)
Potassium: 4.2 mmol/L (ref 3.5–5.2)
Sodium: 138 mmol/L (ref 134–144)
Total Protein: 7.3 g/dL (ref 6.0–8.5)
eGFR: 96 mL/min/{1.73_m2} (ref >59)

## 2022-12-12 LAB — CBC WITH DIFFERENTIAL/PLATELET
Basophils Absolute: 0 10*3/uL (ref 0.0–0.2)
Basos: 0 %
EOS (ABSOLUTE): 0.4 10*3/uL (ref 0.0–0.4)
Eos: 8 %
Hematocrit: 35.5 % (ref 34.0–46.6)
Hemoglobin: 12 g/dL (ref 11.1–15.9)
Immature Grans (Abs): 0 10*3/uL (ref 0.0–0.1)
Immature Granulocytes: 0 %
Lymphocytes Absolute: 2.1 10*3/uL (ref 0.7–3.1)
Lymphs: 42 %
MCH: 30.1 pg (ref 26.6–33.0)
MCHC: 33.8 g/dL (ref 31.5–35.7)
MCV: 89 fL (ref 79–97)
Monocytes Absolute: 0.4 10*3/uL (ref 0.1–0.9)
Monocytes: 8 %
Neutrophils Absolute: 2.1 10*3/uL (ref 1.4–7.0)
Neutrophils: 42 %
Platelets: 293 10*3/uL (ref 150–450)
RBC: 3.99 x10E6/uL (ref 3.77–5.28)
RDW: 12.8 % (ref 11.7–15.4)
WBC: 5 10*3/uL (ref 3.4–10.8)

## 2022-12-12 LAB — VITAMIN D 25 HYDROXY (VIT D DEFICIENCY, FRACTURES): Vit D, 25-Hydroxy: 16.1 ng/mL — ABNORMAL LOW (ref 30.0–100.0)

## 2022-12-12 LAB — THYROID PANEL WITH TSH
Free Thyroxine Index: 2.1 (ref 1.2–4.9)
T3 Uptake Ratio: 24 % (ref 24–39)
T4, Total: 8.8 ug/dL (ref 4.5–12.0)
TSH: 1.08 u[IU]/mL (ref 0.450–4.500)

## 2022-12-12 MED ORDER — VITAMIN D (ERGOCALCIFEROL) 1.25 MG (50000 UNIT) PO CAPS
50000.0000 [IU] | ORAL_CAPSULE | ORAL | 3 refills | Status: DC
  Filled 2022-12-12 – 2023-02-20 (×2): qty 12, 84d supply, fill #0
  Filled 2023-05-09: qty 8, 56d supply, fill #1

## 2022-12-12 NOTE — Addendum Note (Signed)
Addended by: Baruch Gouty on: 12/12/2022 02:49 PM   Modules accepted: Orders

## 2022-12-13 ENCOUNTER — Other Ambulatory Visit (HOSPITAL_COMMUNITY): Payer: Self-pay

## 2022-12-15 ENCOUNTER — Other Ambulatory Visit (HOSPITAL_COMMUNITY): Payer: Self-pay

## 2022-12-15 ENCOUNTER — Other Ambulatory Visit: Payer: Self-pay

## 2022-12-16 ENCOUNTER — Other Ambulatory Visit (HOSPITAL_COMMUNITY): Payer: Self-pay

## 2022-12-22 ENCOUNTER — Other Ambulatory Visit: Payer: Self-pay

## 2023-01-03 ENCOUNTER — Other Ambulatory Visit (HOSPITAL_COMMUNITY): Payer: Self-pay

## 2023-01-05 ENCOUNTER — Encounter: Payer: Self-pay | Admitting: Family Medicine

## 2023-01-05 ENCOUNTER — Other Ambulatory Visit: Payer: Self-pay

## 2023-01-26 ENCOUNTER — Other Ambulatory Visit: Payer: Self-pay

## 2023-01-27 ENCOUNTER — Other Ambulatory Visit (HOSPITAL_COMMUNITY): Payer: Self-pay

## 2023-01-27 ENCOUNTER — Other Ambulatory Visit: Payer: Self-pay

## 2023-01-29 ENCOUNTER — Encounter: Payer: Self-pay | Admitting: Family Medicine

## 2023-01-29 ENCOUNTER — Ambulatory Visit: Payer: Commercial Managed Care - PPO | Admitting: Family Medicine

## 2023-01-29 ENCOUNTER — Other Ambulatory Visit (HOSPITAL_COMMUNITY): Payer: Self-pay

## 2023-01-29 VITALS — BP 96/65 | HR 79 | Temp 98.0°F | Ht 70.0 in | Wt 226.2 lb

## 2023-01-29 DIAGNOSIS — Z713 Dietary counseling and surveillance: Secondary | ICD-10-CM

## 2023-01-29 DIAGNOSIS — Z7689 Persons encountering health services in other specified circumstances: Secondary | ICD-10-CM | POA: Diagnosis not present

## 2023-01-29 DIAGNOSIS — E559 Vitamin D deficiency, unspecified: Secondary | ICD-10-CM

## 2023-01-29 DIAGNOSIS — Z6832 Body mass index (BMI) 32.0-32.9, adult: Secondary | ICD-10-CM | POA: Diagnosis not present

## 2023-01-29 MED ORDER — CONTRAVE 8-90 MG PO TB12
ORAL_TABLET | ORAL | 0 refills | Status: DC
  Filled 2023-01-29: qty 70, 30d supply, fill #0
  Filled 2023-03-09: qty 70, 28d supply, fill #0

## 2023-01-29 MED ORDER — CONTRAVE 8-90 MG PO TB12: 2.0000 | ORAL_TABLET | Freq: Two times a day (BID) | ORAL | 2 refills | Status: DC

## 2023-01-29 MED ORDER — CONTRAVE 8-90 MG PO TB12
2.0000 | ORAL_TABLET | Freq: Two times a day (BID) | ORAL | 2 refills | Status: DC
Start: 2023-01-29 — End: 2023-05-20
  Filled 2023-01-29 – 2023-04-03 (×5): qty 120, 30d supply, fill #0
  Filled 2023-05-09: qty 120, 30d supply, fill #1

## 2023-01-29 NOTE — Addendum Note (Signed)
Addended by: Sonny Masters on: 01/29/2023 08:08 PM   Modules accepted: Level of Service

## 2023-01-29 NOTE — Progress Notes (Signed)
Subjective:  Patient ID: Catherine Davis, female    DOB: June 19, 1986, 37 y.o.   MRN: [id number]  Patient Care Team: Sonny Masters, FNP as PCP - General (Family Medicine)   Chief Complaint:  Weight Management Screening   HPI: Catherine Davis is a 37 y.o. female presenting on 01/29/2023 for Weight Management Screening   1. BMI 32.0-32.9,adult 2. Encounter for weight management Pt has been on Wegovy and doing great. She started at 300 lbs and is now down to 226 lbs. She has modified her diet and is active daily. Her insurance will no longer cover medication and she would like to discuss other options. She has tried orlistat in the past and did not tolerate due to abnormal bowel movements.   3. Vitamin D deficiency Pt is taking oral repletion therapy. Denies bone pain and tenderness, muscle weakness, fracture, and difficulty walking. Lab Results  Component Value Date   VD25OH 16.1 (L) 12/11/2022   VD25OH 11.4 (L) 05/28/2022   Lab Results  Component Value Date   CALCIUM 9.3 12/11/2022         Relevant past medical, surgical, family, and social history reviewed and updated as indicated.  Allergies and medications reviewed and updated. Data reviewed: Chart in Epic.   History reviewed. No pertinent past medical history.  Past Surgical History:  Procedure Laterality Date   ECTOPIC PREGNANCY SURGERY     LAPAROSCOPIC TUBAL LIGATION Bilateral 06/29/2019   Procedure: LAPAROSCOPIC BILATERAL TUBAL LIGATION USING ELECTROCAUTERY;  Surgeon: Lazaro Arms, MD;  Location: [facility];  Service: Gynecology;  Laterality: Bilateral;    Social History   Socioeconomic History   Marital status: Single    Spouse name: Not on file   Number of children: 3   Years of education: Not on file   Highest education level: Not on file  Occupational History   Not on file  Tobacco Use   Smoking status: Some Days    Packs/day: 0.50    Years: 3.00    Additional pack years: 0.00    Total pack years:  1.50    Types: Cigarettes   Smokeless tobacco: Never  Vaping Use   Vaping Use: Never used  Substance and Sexual Activity   Alcohol use: No   Drug use: Yes    Types: Marijuana    Comment: every day   Sexual activity: Yes    Birth control/protection: Pill  Other Topics Concern   Not on file  Social History Narrative   Not on file   Social Determinants of Health   Financial Resource Strain: Not on file  Food Insecurity: Not on file  Transportation Needs: Not on file  Physical Activity: Not on file  Stress: Not on file  Social Connections: Not on file  Intimate Partner Violence: Not on file    Outpatient Encounter Medications as of 01/29/2023  Medication Sig   escitalopram (LEXAPRO) 10 MG tablet Take 1 tablet (10 mg total) by mouth daily.   hydrOXYzine (ATARAX) 10 MG tablet Take 1 tablet (10 mg total) by mouth 3 (three) times daily as needed.   Naltrexone-buPROPion HCl ER (CONTRAVE) 8-90 MG TB12 1 tablet Po Qam x7d ; then 1 tab po BID x 7 d; then 2 tab in AM and 1 in PM X7d; Then 2 po BID from then on   Semaglutide-Weight Management (WEGOVY) 2.4 MG/0.75ML SOAJ Inject 2.4 mg into the skin once a week.   Vitamin D, Ergocalciferol, (DRISDOL) 1.25 MG (50000  UNIT) CAPS capsule Take 1 capsule (50,000 Units total) by mouth every 7 (seven) days.   [DISCONTINUED] Naltrexone-buPROPion HCl ER (CONTRAVE) 8-90 MG TB12 Take 2 tablets by mouth 2 (two) times daily.   Naltrexone-buPROPion HCl ER (CONTRAVE) 8-90 MG TB12 Take 2 tablets by mouth 2 (two) times daily.   No facility-administered encounter medications on file as of 01/29/2023.    No Known Allergies  Review of Systems  Constitutional:  Negative for activity change, appetite change, chills, diaphoresis, fatigue, fever and unexpected weight change.  HENT: Negative.    Eyes: Negative.  Negative for photophobia and visual disturbance.  Respiratory:  Negative for cough, chest tightness and shortness of breath.   Cardiovascular:  Negative  for chest pain, palpitations and leg swelling.  Gastrointestinal:  Negative for abdominal pain, blood in stool, constipation, diarrhea, nausea and vomiting.  Endocrine: Negative.   Genitourinary:  Negative for decreased urine volume, difficulty urinating, dysuria, frequency and urgency.  Musculoskeletal:  Negative for arthralgias and myalgias.  Skin: Negative.   Allergic/Immunologic: Negative.   Neurological:  Negative for dizziness, tremors, seizures, syncope, facial asymmetry, speech difficulty, weakness, light-headedness, numbness and headaches.  Hematological: Negative.   Psychiatric/Behavioral:  Negative for confusion, hallucinations, sleep disturbance and suicidal ideas.   All other systems reviewed and are negative.       Objective:  BP 96/65   Pulse 79   Temp 98 F (36.7 C) (Temporal)   Ht  (1.778 m)   Wt 226 lb 3.2 oz (102.6 kg)   LMP 01/12/2023   SpO2 98%   BMI 32.46 kg/m    Wt Readings from Last 3 Encounters:  01/29/23 226 lb 3.2 oz (102.6 kg)  12/11/22 231 lb 9.6 oz (105.1 kg)  05/16/22 273 lb 6.4 oz (124 kg)    Physical Exam Vitals and nursing note reviewed.  Constitutional:      General: She is not in acute distress.    Appearance: Normal appearance. She is well-developed and well-groomed. She is obese. She is not ill-appearing, toxic-appearing or diaphoretic.  HENT:     Head: Normocephalic and atraumatic.     Jaw: There is normal jaw occlusion.     Right Ear: Hearing normal.     Left Ear: Hearing normal.     Nose: Nose normal.     Mouth/Throat:     Lips: Pink.     Mouth: Mucous membranes are moist.     Pharynx: Uvula midline.  Eyes:     General: Lids are normal.     Conjunctiva/sclera: Conjunctivae normal.     Pupils: Pupils are equal, round, and reactive to light.  Neck:     Trachea: Trachea and phonation normal.  Cardiovascular:     Rate and Rhythm: Normal rate and regular rhythm.     Chest Panas: PMI is not displaced.     Pulses: Normal  pulses.     Heart sounds: Normal heart sounds. No murmur heard.    No friction rub. No gallop.  Pulmonary:     Effort: Pulmonary effort is normal. No respiratory distress.     Breath sounds: Normal breath sounds. No wheezing.  Abdominal:     General: There is no abdominal bruit.     Palpations: There is no hepatomegaly or splenomegaly.  Musculoskeletal:     Cervical back: Normal range of motion and neck supple.     Right lower leg: No edema.     Left lower leg: No edema.  Skin:  General: Skin is warm and dry.     Capillary Refill: Capillary refill takes less than 2 seconds.     Coloration: Skin is not cyanotic, jaundiced or pale.     Findings: No rash.  Neurological:     General: No focal deficit present.     Mental Status: She is alert and oriented to person, place, and time.     Sensory: Sensation is intact.     Motor: Motor function is intact.     Coordination: Coordination is intact.     Gait: Gait is intact.     Deep Tendon Reflexes: Reflexes are normal and symmetric.  Psychiatric:        Attention and Perception: Attention and perception normal.        Mood and Affect: Mood and affect normal.        Speech: Speech normal.        Behavior: Behavior normal. Behavior is cooperative.        Thought Content: Thought content normal.        Cognition and Memory: Cognition and memory normal.        Judgment: Judgment normal.     Results for orders placed or performed in visit on 12/11/22  Vitamin D, 25-hydroxy  Result Value Ref Range   Vit D, 25-Hydroxy 16.1 (L) 30.0 - 100.0 ng/mL  CMP14+EGFR  Result Value Ref Range   Glucose 81 70 - 99 mg/dL   BUN 14 6 - 20 mg/dL   Creatinine, Ser 1.61 0.57 - 1.00 mg/dL   eGFR 96 >09 UE/AVW/0.98   BUN/Creatinine Ratio 17 9 - 23   Sodium 138 134 - 144 mmol/L   Potassium 4.2 3.5 - 5.2 mmol/L   Chloride 102 96 - 106 mmol/L   CO2 19 (L) 20 - 29 mmol/L   Calcium 9.3 8.7 - 10.2 mg/dL   Total Protein 7.3 6.0 - 8.5 g/dL   Albumin 4.5  3.9 - 4.9 g/dL   Globulin, Total 2.8 1.5 - 4.5 g/dL   Albumin/Globulin Ratio 1.6 1.2 - 2.2   Bilirubin Total 0.3 0.0 - 1.2 mg/dL   Alkaline Phosphatase 86 44 - 121 IU/L   AST 15 0 - 40 IU/L   ALT 9 0 - 32 IU/L  CBC with Differential/Platelet  Result Value Ref Range   WBC 5.0 3.4 - 10.8 x10E3/uL   RBC 3.99 3.77 - 5.28 x10E6/uL   Hemoglobin 12.0 11.1 - 15.9 g/dL   Hematocrit 11.9 14.7 - 46.6 %   MCV 89 79 - 97 fL   MCH 30.1 26.6 - 33.0 pg   MCHC 33.8 31.5 - 35.7 g/dL   RDW 82.9 56.2 - 13.0 %   Platelets 293 150 - 450 x10E3/uL   Neutrophils 42 Not Estab. %   Lymphs 42 Not Estab. %   Monocytes 8 Not Estab. %   Eos 8 Not Estab. %   Basos 0 Not Estab. %   Neutrophils Absolute 2.1 1.4 - 7.0 x10E3/uL   Lymphocytes Absolute 2.1 0.7 - 3.1 x10E3/uL   Monocytes Absolute 0.4 0.1 - 0.9 x10E3/uL   EOS (ABSOLUTE) 0.4 0.0 - 0.4 x10E3/uL   Basophils Absolute 0.0 0.0 - 0.2 x10E3/uL   Immature Granulocytes 0 Not Estab. %   Immature Grans (Abs) 0.0 0.0 - 0.1 x10E3/uL  Thyroid Panel With TSH  Result Value Ref Range   TSH 1.080 0.450 - 4.500 uIU/mL   T4, Total 8.8 4.5 - 12.0 ug/dL   T3 Uptake Ratio  24 24 - 39 %   Free Thyroxine Index 2.1 1.2 - 4.9       Pertinent labs & imaging results that were available during my care of the patient were reviewed by me and considered in my medical decision making.  Assessment & Plan:  Jeanett was seen today for weight management screening.  Diagnoses and all orders for this visit:  BMI 32.0-32.9,adult Encounter for weight management Has done very well with Wegovy but insurance will no longer cover medication. Will initiate Contrave. Discussed medications in detail. Labs pending. Pt to follow up in 3 months for repeat BMI and labs.  -     CBC with Differential/Platelet -     CMP14+EGFR -     Thyroid Panel With TSH -     VITAMIN D 25 Hydroxy (Vit-D Deficiency, Fractures) -     Naltrexone-buPROPion HCl ER (CONTRAVE) 8-90 MG TB12; 1 tablet Po Qam x7d ;  then 1 tab po BID x 7 d; then 2 tab in AM and 1 in PM X7d; Then 2 po BID from then on -     Naltrexone-buPROPion HCl ER (CONTRAVE) 8-90 MG TB12; Take 2 tablets by mouth 2 (two) times daily.  Vitamin D deficiency On repletion therapy. Will recheck labs today.  -     CMP14+EGFR -     VITAMIN D 25 Hydroxy (Vit-D Deficiency, Fractures)     Continue all other maintenance medications.  Follow up plan: Return in about 3 months (around 04/30/2023) for BMI.   Continue healthy lifestyle choices, including diet (rich in fruits, vegetables, and lean proteins, and low in salt and simple carbohydrates) and exercise (at least 30 minutes of moderate physical activity daily).  Educational handout given for calorie counting for weight loss, Contrave  The above assessment and management plan was discussed with the patient. The patient verbalized understanding of and has agreed to the management plan. Patient is aware to call the clinic if they develop any new symptoms or if symptoms persist or worsen. Patient is aware when to return to the clinic for a follow-up visit. Patient educated on when it is appropriate to go to the emergency department.   Kari Baars, FNP-C [facility] [phone]

## 2023-01-30 LAB — CBC WITH DIFFERENTIAL/PLATELET
Basophils Absolute: 0 10*3/uL (ref 0.0–0.2)
Basos: 0 %
EOS (ABSOLUTE): 0.3 10*3/uL (ref 0.0–0.4)
Eos: 7 %
Hematocrit: 37.3 % (ref 34.0–46.6)
Hemoglobin: 12.2 g/dL (ref 11.1–15.9)
Immature Grans (Abs): 0 10*3/uL (ref 0.0–0.1)
Immature Granulocytes: 0 %
Lymphocytes Absolute: 1.7 10*3/uL (ref 0.7–3.1)
Lymphs: 43 %
MCH: 29.2 pg (ref 26.6–33.0)
MCHC: 32.7 g/dL (ref 31.5–35.7)
MCV: 89 fL (ref 79–97)
Monocytes Absolute: 0.4 10*3/uL (ref 0.1–0.9)
Monocytes: 11 %
Neutrophils Absolute: 1.6 10*3/uL (ref 1.4–7.0)
Neutrophils: 39 %
Platelets: 306 10*3/uL (ref 150–450)
RBC: 4.18 x10E6/uL (ref 3.77–5.28)
RDW: 12.7 % (ref 11.7–15.4)
WBC: 4.1 10*3/uL (ref 3.4–10.8)

## 2023-01-30 LAB — THYROID PANEL WITH TSH
Free Thyroxine Index: 2.3 (ref 1.2–4.9)
T3 Uptake Ratio: 25 % (ref 24–39)
T4, Total: 9.1 ug/dL (ref 4.5–12.0)
TSH: 0.541 u[IU]/mL (ref 0.450–4.500)

## 2023-01-30 LAB — CMP14+EGFR
ALT: 9 IU/L (ref 0–32)
AST: 13 IU/L (ref 0–40)
Albumin/Globulin Ratio: 1.5 (ref 1.2–2.2)
Albumin: 4.5 g/dL (ref 3.9–4.9)
Alkaline Phosphatase: 75 IU/L (ref 44–121)
BUN/Creatinine Ratio: 12 (ref 9–23)
BUN: 10 mg/dL (ref 6–20)
Bilirubin Total: 0.3 mg/dL (ref 0.0–1.2)
CO2: 20 mmol/L (ref 20–29)
Calcium: 9.3 mg/dL (ref 8.7–10.2)
Chloride: 104 mmol/L (ref 96–106)
Creatinine, Ser: 0.83 mg/dL (ref 0.57–1.00)
Globulin, Total: 3 g/dL (ref 1.5–4.5)
Glucose: 78 mg/dL (ref 70–99)
Potassium: 4.3 mmol/L (ref 3.5–5.2)
Sodium: 136 mmol/L (ref 134–144)
Total Protein: 7.5 g/dL (ref 6.0–8.5)
eGFR: 94 mL/min/{1.73_m2} (ref >59)

## 2023-01-30 LAB — VITAMIN D 25 HYDROXY (VIT D DEFICIENCY, FRACTURES): Vit D, 25-Hydroxy: 35 ng/mL (ref 30.0–100.0)

## 2023-02-02 ENCOUNTER — Other Ambulatory Visit (HOSPITAL_COMMUNITY): Payer: Self-pay

## 2023-02-07 ENCOUNTER — Other Ambulatory Visit (HOSPITAL_COMMUNITY): Payer: Self-pay

## 2023-02-20 ENCOUNTER — Other Ambulatory Visit (HOSPITAL_COMMUNITY): Payer: Self-pay

## 2023-02-20 ENCOUNTER — Other Ambulatory Visit: Payer: Self-pay

## 2023-02-21 ENCOUNTER — Other Ambulatory Visit (HOSPITAL_COMMUNITY): Payer: Self-pay

## 2023-02-23 ENCOUNTER — Other Ambulatory Visit (HOSPITAL_COMMUNITY): Payer: Self-pay

## 2023-02-24 ENCOUNTER — Other Ambulatory Visit (HOSPITAL_COMMUNITY): Payer: Self-pay

## 2023-02-25 ENCOUNTER — Other Ambulatory Visit (HOSPITAL_COMMUNITY): Payer: Self-pay

## 2023-02-26 ENCOUNTER — Other Ambulatory Visit (HOSPITAL_COMMUNITY): Payer: Self-pay

## 2023-02-27 ENCOUNTER — Encounter: Payer: Self-pay | Admitting: Family Medicine

## 2023-02-27 ENCOUNTER — Telehealth: Payer: Self-pay

## 2023-02-27 ENCOUNTER — Other Ambulatory Visit (HOSPITAL_COMMUNITY): Payer: Self-pay

## 2023-02-27 NOTE — Telephone Encounter (Signed)
Catherine Davis (Key: [id number]) PA Case ID #: [id number] Rx #: [id number] Need Help? Call us at [phone] Status sent iconSent to Plan today Drug Contrave 8-90MG  er tablets ePA cloud logo Form [employer] ePA Form 2017 NCPDP

## 2023-03-03 ENCOUNTER — Other Ambulatory Visit (HOSPITAL_COMMUNITY): Payer: Self-pay

## 2023-03-04 ENCOUNTER — Other Ambulatory Visit (HOSPITAL_COMMUNITY): Payer: Self-pay

## 2023-03-04 NOTE — Telephone Encounter (Signed)
Patient Advocate Encounter  Prior Authorization for Contrave 8-90MG  er tablets has been approved with [employer].    PA# [id number] Effective dates: 04/03/23 through 07/02/23

## 2023-03-09 ENCOUNTER — Other Ambulatory Visit (HOSPITAL_COMMUNITY): Payer: Self-pay

## 2023-03-09 ENCOUNTER — Other Ambulatory Visit: Payer: Self-pay

## 2023-03-10 ENCOUNTER — Other Ambulatory Visit (HOSPITAL_COMMUNITY): Payer: Self-pay

## 2023-04-01 ENCOUNTER — Other Ambulatory Visit: Payer: Self-pay

## 2023-04-01 ENCOUNTER — Other Ambulatory Visit (HOSPITAL_COMMUNITY): Payer: Self-pay

## 2023-04-01 ENCOUNTER — Encounter: Payer: Self-pay | Admitting: Family Medicine

## 2023-04-02 ENCOUNTER — Other Ambulatory Visit (HOSPITAL_COMMUNITY): Payer: Self-pay

## 2023-04-03 ENCOUNTER — Other Ambulatory Visit (HOSPITAL_COMMUNITY): Payer: Self-pay

## 2023-04-21 ENCOUNTER — Ambulatory Visit: Payer: Commercial Managed Care - PPO | Admitting: Family Medicine

## 2023-04-24 ENCOUNTER — Ambulatory Visit: Payer: Commercial Managed Care - PPO | Admitting: Family Medicine

## 2023-05-04 ENCOUNTER — Encounter: Payer: Self-pay | Admitting: Family Medicine

## 2023-05-07 ENCOUNTER — Other Ambulatory Visit (HOSPITAL_COMMUNITY): Payer: Self-pay

## 2023-05-08 ENCOUNTER — Other Ambulatory Visit (HOSPITAL_COMMUNITY): Payer: Self-pay

## 2023-05-08 ENCOUNTER — Telehealth: Payer: Commercial Managed Care - PPO | Admitting: Physician Assistant

## 2023-05-08 DIAGNOSIS — M545 Low back pain, unspecified: Secondary | ICD-10-CM

## 2023-05-08 MED ORDER — NAPROXEN 500 MG PO TABS
500.0000 mg | ORAL_TABLET | Freq: Two times a day (BID) | ORAL | 0 refills | Status: DC
Start: 2023-05-08 — End: 2023-05-20
  Filled 2023-05-08: qty 30, 15d supply, fill #0

## 2023-05-08 MED ORDER — CYCLOBENZAPRINE HCL 10 MG PO TABS
5.0000 mg | ORAL_TABLET | Freq: Three times a day (TID) | ORAL | 0 refills | Status: DC | PRN
Start: 2023-05-08 — End: 2023-05-20
  Filled 2023-05-08: qty 30, 10d supply, fill #0

## 2023-05-08 NOTE — Progress Notes (Signed)

## 2023-05-10 ENCOUNTER — Other Ambulatory Visit (HOSPITAL_COMMUNITY): Payer: Self-pay

## 2023-05-20 ENCOUNTER — Other Ambulatory Visit (HOSPITAL_COMMUNITY): Payer: Self-pay

## 2023-05-20 ENCOUNTER — Ambulatory Visit (INDEPENDENT_AMBULATORY_CARE_PROVIDER_SITE_OTHER): Payer: Commercial Managed Care - PPO | Admitting: Family Medicine

## 2023-05-20 ENCOUNTER — Encounter: Payer: Self-pay | Admitting: Family Medicine

## 2023-05-20 VITALS — BP 125/87 | HR 86 | Temp 97.5°F | Ht 70.0 in | Wt 229.2 lb

## 2023-05-20 DIAGNOSIS — Z6832 Body mass index (BMI) 32.0-32.9, adult: Secondary | ICD-10-CM

## 2023-05-20 DIAGNOSIS — E663 Overweight: Secondary | ICD-10-CM | POA: Diagnosis not present

## 2023-05-20 DIAGNOSIS — E559 Vitamin D deficiency, unspecified: Secondary | ICD-10-CM

## 2023-05-20 DIAGNOSIS — Z7689 Persons encountering health services in other specified circumstances: Secondary | ICD-10-CM | POA: Diagnosis not present

## 2023-05-20 LAB — BAYER DCA HB A1C WAIVED: HB A1C (BAYER DCA - WAIVED): 4.8 % (ref 4.8–5.6)

## 2023-05-20 MED ORDER — CONTRAVE 8-90 MG PO TB12
2.0000 | ORAL_TABLET | Freq: Two times a day (BID) | ORAL | 2 refills | Status: DC
Start: 2023-05-20 — End: 2023-07-29
  Filled 2023-05-20 – 2023-06-12 (×2): qty 120, 30d supply, fill #0
  Filled 2023-07-13 – 2023-07-20 (×5): qty 120, 30d supply, fill #1

## 2023-05-20 NOTE — Progress Notes (Signed)
Subjective:  Patient ID: Catherine Davis, female    DOB: Oct 06, 1986, 37 y.o.   MRN: [id number]  Patient Care Team: Sonny Masters, FNP as PCP - General (Family Medicine)   Chief Complaint:  BMI (3 month follow up )   HPI: Catherine Davis is a 37 y.o. female presenting on 05/20/2023 for BMI (3 month follow up )   1. BMI 32.0-32.9,adult 2. Encounter for weight management Doing well on Contrave. States she had some side effects at the beginning but that has since resolved. She does have some constipation but she takes a stool softener with great results.   3. Vitamin D deficiency Pt is taking oral repletion therapy. Denies bone pain and tenderness, muscle weakness, fracture, and difficulty walking. Lab Results  Component Value Date   VD25OH 35.0 01/29/2023   VD25OH 16.1 (L) 12/11/2022   VD25OH 11.4 (L) 05/28/2022   Lab Results  Component Value Date   CALCIUM 9.3 01/29/2023         Relevant past medical, surgical, family, and social history reviewed and updated as indicated.  Allergies and medications reviewed and updated. Data reviewed: Chart in Epic.   History reviewed. No pertinent past medical history.  Past Surgical History:  Procedure Laterality Date   ECTOPIC PREGNANCY SURGERY     LAPAROSCOPIC TUBAL LIGATION Bilateral 06/29/2019   Procedure: LAPAROSCOPIC BILATERAL TUBAL LIGATION USING ELECTROCAUTERY;  Surgeon: Lazaro Arms, MD;  Location: [facility];  Service: Gynecology;  Laterality: Bilateral;    Social History   Socioeconomic History   Marital status: Single    Spouse name: Not on file   Number of children: 3   Years of education: Not on file   Highest education level: Not on file  Occupational History   Not on file  Tobacco Use   Smoking status: Some Days    Current packs/day: 0.50    Average packs/day: 0.5 packs/day for 3.0 years (1.5 ttl pk-yrs)    Types: Cigarettes   Smokeless tobacco: Never  Vaping Use   Vaping status: Never Used  Substance and  Sexual Activity   Alcohol use: No   Drug use: Yes    Types: Marijuana    Comment: every day   Sexual activity: Yes    Birth control/protection: Pill  Other Topics Concern   Not on file  Social History Narrative   Not on file   Social Determinants of Health   Financial Resource Strain: Not on file  Food Insecurity: Not on file  Transportation Needs: Not on file  Physical Activity: Not on file  Stress: Not on file  Social Connections: Not on file  Intimate Partner Violence: Not on file    Outpatient Encounter Medications as of 05/20/2023  Medication Sig   escitalopram (LEXAPRO) 10 MG tablet Take 1 tablet (10 mg total) by mouth daily.   hydrOXYzine (ATARAX) 10 MG tablet Take 1 tablet (10 mg total) by mouth 3 (three) times daily as needed.   Vitamin D, Ergocalciferol, (DRISDOL) 1.25 MG (50000 UNIT) CAPS capsule Take 1 capsule (50,000 Units total) by mouth every 7 (seven) days.   [DISCONTINUED] Naltrexone-buPROPion HCl ER (CONTRAVE) 8-90 MG TB12 Take 2 tablets by mouth 2 (two) times daily.   Naltrexone-buPROPion HCl ER (CONTRAVE) 8-90 MG TB12 Take 2 tablets by mouth 2 (two) times daily.   [DISCONTINUED] cyclobenzaprine (FLEXERIL) 10 MG tablet Take 0.5-1 tablets (5-10 mg total) by mouth 3 (three) times daily as needed.   [DISCONTINUED] Naltrexone-buPROPion HCl  ER (CONTRAVE) 8-90 MG TB12 Take 1 tablet by mouth every morning for 7 days. Then take 1 tablet twice a day for 7 days. Then take 2 tablets in the morning and one in the evening for 7 days. Then take 2 tablets in the morning and 2 tablets in the evening from then on. (Patient not taking: Reported on 05/20/2023)   [DISCONTINUED] naproxen (NAPROSYN) 500 MG tablet Take 1 tablet (500 mg total) by mouth 2 (two) times daily with a meal.   No facility-administered encounter medications on file as of 05/20/2023.    No Known Allergies  Review of Systems  Constitutional:  Negative for activity change, appetite change, chills, diaphoresis,  fatigue, fever and unexpected weight change.  HENT: Negative.    Eyes: Negative.  Negative for photophobia and visual disturbance.  Respiratory:  Negative for cough, chest tightness and shortness of breath.   Cardiovascular:  Negative for chest pain, palpitations and leg swelling.  Gastrointestinal:  Positive for constipation. Negative for abdominal distention, abdominal pain, anal bleeding, blood in stool, diarrhea, nausea, rectal pain and vomiting.  Endocrine: Negative.  Negative for cold intolerance, heat intolerance, polydipsia, polyphagia and polyuria.  Genitourinary:  Negative for decreased urine volume, difficulty urinating, dysuria, frequency and urgency.  Musculoskeletal:  Negative for arthralgias and myalgias.  Skin: Negative.   Allergic/Immunologic: Negative.   Neurological:  Negative for dizziness, tremors, seizures, syncope, facial asymmetry, speech difficulty, weakness, light-headedness, numbness and headaches.  Hematological: Negative.   Psychiatric/Behavioral:  Negative for confusion, hallucinations, sleep disturbance and suicidal ideas.   All other systems reviewed and are negative.       Objective:  BP 125/87   Pulse 86   Temp (!) 97.5 F (36.4 C) (Temporal)   Ht 5\' 10"  (1.778 m)   Wt 229 lb 3.2 oz (104 kg)   SpO2 99%   BMI 32.89 kg/m    Wt Readings from Last 3 Encounters:  05/20/23 229 lb 3.2 oz (104 kg)  01/29/23 226 lb 3.2 oz (102.6 kg)  12/11/22 231 lb 9.6 oz (105.1 kg)    Physical Exam Vitals and nursing note reviewed.  Constitutional:      General: She is not in acute distress.    Appearance: Normal appearance. She is obese. She is not ill-appearing, toxic-appearing or diaphoretic.  HENT:     Head: Normocephalic and atraumatic.     Nose: Nose normal.     Mouth/Throat:     Mouth: Mucous membranes are moist.  Eyes:     Conjunctiva/sclera: Conjunctivae normal.     Pupils: Pupils are equal, round, and reactive to light.  Cardiovascular:     Rate  and Rhythm: Normal rate and regular rhythm.     Heart sounds: Normal heart sounds.  Pulmonary:     Effort: Pulmonary effort is normal.     Breath sounds: Normal breath sounds.  Musculoskeletal:     Cervical back: Neck supple.     Right lower leg: No edema.     Left lower leg: No edema.  Skin:    General: Skin is warm and dry.     Capillary Refill: Capillary refill takes less than 2 seconds.  Neurological:     General: No focal deficit present.     Mental Status: She is alert and oriented to person, place, and time.  Psychiatric:        Mood and Affect: Mood normal.        Behavior: Behavior normal.  Thought Content: Thought content normal.        Judgment: Judgment normal.     Results for orders placed or performed in visit on 01/29/23  CBC with Differential/Platelet  Result Value Ref Range   WBC 4.1 3.4 - 10.8 x10E3/uL   RBC 4.18 3.77 - 5.28 x10E6/uL   Hemoglobin 12.2 11.1 - 15.9 g/dL   Hematocrit 16.1 09.6 - 46.6 %   MCV 89 79 - 97 fL   MCH 29.2 26.6 - 33.0 pg   MCHC 32.7 31.5 - 35.7 g/dL   RDW 04.5 40.9 - 81.1 %   Platelets 306 150 - 450 x10E3/uL   Neutrophils 39 Not Estab. %   Lymphs 43 Not Estab. %   Monocytes 11 Not Estab. %   Eos 7 Not Estab. %   Basos 0 Not Estab. %   Neutrophils Absolute 1.6 1.4 - 7.0 x10E3/uL   Lymphocytes Absolute 1.7 0.7 - 3.1 x10E3/uL   Monocytes Absolute 0.4 0.1 - 0.9 x10E3/uL   EOS (ABSOLUTE) 0.3 0.0 - 0.4 x10E3/uL   Basophils Absolute 0.0 0.0 - 0.2 x10E3/uL   Immature Granulocytes 0 Not Estab. %   Immature Grans (Abs) 0.0 0.0 - 0.1 x10E3/uL  CMP14+EGFR  Result Value Ref Range   Glucose 78 70 - 99 mg/dL   BUN 10 6 - 20 mg/dL   Creatinine, Ser 9.14 0.57 - 1.00 mg/dL   eGFR 94 >78 GN/FAO/1.30   BUN/Creatinine Ratio 12 9 - 23   Sodium 136 134 - 144 mmol/L   Potassium 4.3 3.5 - 5.2 mmol/L   Chloride 104 96 - 106 mmol/L   CO2 20 20 - 29 mmol/L   Calcium 9.3 8.7 - 10.2 mg/dL   Total Protein 7.5 6.0 - 8.5 g/dL   Albumin 4.5  3.9 - 4.9 g/dL   Globulin, Total 3.0 1.5 - 4.5 g/dL   Albumin/Globulin Ratio 1.5 1.2 - 2.2   Bilirubin Total 0.3 0.0 - 1.2 mg/dL   Alkaline Phosphatase 75 44 - 121 IU/L   AST 13 0 - 40 IU/L   ALT 9 0 - 32 IU/L  Thyroid Panel With TSH  Result Value Ref Range   TSH 0.541 0.450 - 4.500 uIU/mL   T4, Total 9.1 4.5 - 12.0 ug/dL   T3 Uptake Ratio 25 24 - 39 %   Free Thyroxine Index 2.3 1.2 - 4.9  VITAMIN D 25 Hydroxy (Vit-D Deficiency, Fractures)  Result Value Ref Range   Vit D, 25-Hydroxy 35.0 30.0 - 100.0 ng/mL       Pertinent labs & imaging results that were available during my care of the patient were reviewed by me and considered in my medical decision making.  Assessment & Plan:  Catherine Davis was seen today for bmi.  Diagnoses and all orders for this visit:  BMI 32.0-32.9,adult Encounter for weight management  Will update labs today. Diet and exercise encouraged. Doing well on below, will continue.  -     Naltrexone-buPROPion HCl ER (CONTRAVE) 8-90 MG TB12; Take 2 tablets by mouth 2 (two) times daily. -     CMP14+EGFR -     CBC with Differential/Platelet -     Thyroid Panel With TSH -     VITAMIN D 25 Hydroxy (Vit-D Deficiency, Fractures) -     Lipid panel -     Bayer DCA Hb A1c Waived   Vitamin D deficiency Labs pending. Continue repletion therapy. If indicated, will change repletion dosage. Eat foods rich in Vit D  including milk, orange juice, yogurt with vitamin D added, salmon or mackerel, canned tuna fish, cereals with vitamin D added, and cod liver oil. Get out in the sun but make sure to wear at least SPF 30 sunscreen.  -     CMP14+EGFR -     VITAMIN D 25 Hydroxy (Vit-D Deficiency, Fractures)     Continue all other maintenance medications.  Follow up plan: Return in about 3 months (around 08/20/2023) for BMI.   Continue healthy lifestyle choices, including diet (rich in fruits, vegetables, and lean proteins, and low in salt and simple carbohydrates) and exercise (at  least 30 minutes of moderate physical activity daily).  Educational handout given for calorie counting for weight management   The above assessment and management plan was discussed with the patient. The patient verbalized understanding of and has agreed to the management plan. Patient is aware to call the clinic if they develop any new symptoms or if symptoms persist or worsen. Patient is aware when to return to the clinic for a follow-up visit. Patient educated on when it is appropriate to go to the emergency department.   Kari Baars, FNP-C [facility] [phone]

## 2023-05-22 LAB — CMP14+EGFR
ALT: 12 IU/L (ref 0–32)
AST: 17 IU/L (ref 0–40)
Albumin: 4.2 g/dL (ref 3.9–4.9)
Alkaline Phosphatase: 106 IU/L (ref 44–121)
BUN/Creatinine Ratio: 12 (ref 9–23)
BUN: 11 mg/dL (ref 6–20)
Bilirubin Total: 0.2 mg/dL (ref 0.0–1.2)
CO2: 22 mmol/L (ref 20–29)
Calcium: 8.7 mg/dL (ref 8.7–10.2)
Chloride: 103 mmol/L (ref 96–106)
Creatinine, Ser: 0.92 mg/dL (ref 0.57–1.00)
Globulin, Total: 2.8 g/dL (ref 1.5–4.5)
Glucose: 94 mg/dL (ref 70–99)
Potassium: 4.5 mmol/L (ref 3.5–5.2)
Sodium: 136 mmol/L (ref 134–144)
Total Protein: 7 g/dL (ref 6.0–8.5)
eGFR: 83 mL/min/{1.73_m2} (ref >59)

## 2023-05-22 LAB — CBC WITH DIFFERENTIAL/PLATELET
Basophils Absolute: 0 10*3/uL (ref 0.0–0.2)
Basos: 1 %
EOS (ABSOLUTE): 0.2 10*3/uL (ref 0.0–0.4)
Eos: 6 %
Hematocrit: 37.7 % (ref 34.0–46.6)
Hemoglobin: 12.6 g/dL (ref 11.1–15.9)
Immature Grans (Abs): 0 10*3/uL (ref 0.0–0.1)
Immature Granulocytes: 0 %
Lymphocytes Absolute: 1.2 10*3/uL (ref 0.7–3.1)
Lymphs: 45 %
MCH: 30.5 pg (ref 26.6–33.0)
MCHC: 33.4 g/dL (ref 31.5–35.7)
MCV: 91 fL (ref 79–97)
Monocytes Absolute: 0.4 10*3/uL (ref 0.1–0.9)
Monocytes: 13 %
Neutrophils Absolute: 1 10*3/uL — ABNORMAL LOW (ref 1.4–7.0)
Neutrophils: 35 %
Platelets: 255 10*3/uL (ref 150–450)
RBC: 4.13 x10E6/uL (ref 3.77–5.28)
RDW: 11.9 % (ref 11.7–15.4)
WBC: 2.7 10*3/uL — ABNORMAL LOW (ref 3.4–10.8)

## 2023-05-22 LAB — LIPID PANEL
Chol/HDL Ratio: 4.3 ratio (ref 0.0–4.4)
Cholesterol, Total: 163 mg/dL (ref 100–199)
HDL: 38 mg/dL — ABNORMAL LOW (ref >39)
LDL Chol Calc (NIH): 99 mg/dL (ref 0–99)
Triglycerides: 147 mg/dL (ref 0–149)
VLDL Cholesterol Cal: 26 mg/dL (ref 5–40)

## 2023-05-22 LAB — THYROID PANEL WITH TSH
Free Thyroxine Index: 1.8 (ref 1.2–4.9)
T3 Uptake Ratio: 25 % (ref 24–39)
T4, Total: 7.1 ug/dL (ref 4.5–12.0)
TSH: 0.649 u[IU]/mL (ref 0.450–4.500)

## 2023-05-22 LAB — VITAMIN D 25 HYDROXY (VIT D DEFICIENCY, FRACTURES): Vit D, 25-Hydroxy: 36.5 ng/mL (ref 30.0–100.0)

## 2023-06-12 ENCOUNTER — Other Ambulatory Visit (HOSPITAL_COMMUNITY): Payer: Self-pay

## 2023-06-28 ENCOUNTER — Encounter: Payer: Self-pay | Admitting: Family Medicine

## 2023-07-01 ENCOUNTER — Other Ambulatory Visit: Payer: Self-pay

## 2023-07-01 ENCOUNTER — Other Ambulatory Visit: Payer: Self-pay | Admitting: Family Medicine

## 2023-07-01 ENCOUNTER — Other Ambulatory Visit (HOSPITAL_COMMUNITY): Payer: Self-pay

## 2023-07-01 DIAGNOSIS — E559 Vitamin D deficiency, unspecified: Secondary | ICD-10-CM

## 2023-07-01 MED ORDER — VITAMIN D (ERGOCALCIFEROL) 1.25 MG (50000 UNIT) PO CAPS
50000.0000 [IU] | ORAL_CAPSULE | ORAL | 0 refills | Status: DC
Start: 2023-07-01 — End: 2023-10-20
  Filled 2023-07-01: qty 5, 35d supply, fill #0

## 2023-07-14 ENCOUNTER — Other Ambulatory Visit (HOSPITAL_COMMUNITY): Payer: Self-pay

## 2023-07-15 ENCOUNTER — Other Ambulatory Visit (HOSPITAL_COMMUNITY): Payer: Self-pay

## 2023-07-15 ENCOUNTER — Encounter: Payer: Self-pay | Admitting: Family Medicine

## 2023-07-16 ENCOUNTER — Other Ambulatory Visit (HOSPITAL_COMMUNITY): Payer: Self-pay

## 2023-07-20 ENCOUNTER — Telehealth: Payer: Commercial Managed Care - PPO | Admitting: Physician Assistant

## 2023-07-20 DIAGNOSIS — K047 Periapical abscess without sinus: Secondary | ICD-10-CM

## 2023-07-21 ENCOUNTER — Other Ambulatory Visit (HOSPITAL_COMMUNITY): Payer: Self-pay

## 2023-07-21 MED ORDER — AMOXICILLIN-POT CLAVULANATE 875-125 MG PO TABS
1.0000 | ORAL_TABLET | Freq: Two times a day (BID) | ORAL | 0 refills | Status: DC
Start: 2023-07-21 — End: 2023-07-29
  Filled 2023-07-21: qty 14, 7d supply, fill #0

## 2023-07-21 NOTE — Progress Notes (Signed)
I have spent 5 minutes in review of e-visit questionnaire, review and updating patient chart, medical decision making and response to patient.   Mia Milan Cody Jacklynn Dehaas, PA-C    

## 2023-07-21 NOTE — Progress Notes (Signed)

## 2023-07-22 ENCOUNTER — Telehealth: Payer: Self-pay

## 2023-07-23 ENCOUNTER — Encounter: Payer: Commercial Managed Care - PPO | Admitting: Family Medicine

## 2023-07-24 NOTE — Telephone Encounter (Signed)
Pharmacy Patient Advocate Encounter  Received notification from [facility] that Prior Authorization for Contrave has been APPROVED from 07/20/2023 to 07/23/2024   PA #/Case ID/Reference #: [id number]

## 2023-07-29 ENCOUNTER — Encounter: Payer: Self-pay | Admitting: Family Medicine

## 2023-07-29 ENCOUNTER — Other Ambulatory Visit (HOSPITAL_COMMUNITY): Payer: Self-pay

## 2023-07-29 ENCOUNTER — Ambulatory Visit: Payer: Commercial Managed Care - PPO | Admitting: Family Medicine

## 2023-07-29 DIAGNOSIS — Z7689 Persons encountering health services in other specified circumstances: Secondary | ICD-10-CM | POA: Diagnosis not present

## 2023-07-29 MED ORDER — SEMAGLUTIDE-WEIGHT MANAGEMENT 0.25 MG/0.5ML ~~LOC~~ SOAJ: 0.2500 mg | SUBCUTANEOUS | Status: AC

## 2023-07-29 NOTE — Progress Notes (Signed)
Subjective:  Patient ID: Catherine Davis, female    DOB: [birth year], 37 y.o.   MRN: [id number]  Patient Care Team: Sonny Masters, FNP as PCP - General (Family Medicine)   Chief Complaint:  BMI   HPI: Catherine Davis is a 37 y.o. female presenting on 07/29/2023 for BMI   Discussed the use of AI scribe software for clinical note transcription with the patient, who gave verbal consent to proceed.  History of Present Illness   The patient, previously on Contrave for weight management, reports that the medication has not been effective in suppressing their appetite or reducing food cravings. Despite incorporating gym workouts into their routine a few days a week, they continue to struggle with persistent hunger. They deny experiencing any side effects from the Contrave, but it has not been beneficial in their weight loss journey.  The patient's weight has increased from 229 to 251 since their last visit, indicating that their current weight management strategies are not effective. They have made dietary changes, such as eliminating sodas, and have been consistent with their workout routine, which includes a workout class on Wednesdays and independent exercise on Mondays and Fridays.  Previously, the patient was on Wegovy, which they stopped taking around May. They did not experience any side effects such as nausea or vomiting while on Wegovy.  In addition to their weight management concerns, the patient has recently started taking a Nature Made fish oil supplement to address a low HDL level. Since starting the supplement, they have been experiencing mild but consistent headaches. They have not had their eyes checked recently.  The patient's recent lab work showed normal thyroid, kidney, and liver function. They have no history of pancreatitis or medullary thyroid cancer.       Relevant past medical, surgical, family, and social history reviewed and updated as indicated.  Allergies and  medications reviewed and updated. Data reviewed: Chart in Epic.   History reviewed. No pertinent past medical history.  Past Surgical History:  Procedure Laterality Date   ECTOPIC PREGNANCY SURGERY     LAPAROSCOPIC TUBAL LIGATION Bilateral 06/29/2019   Procedure: LAPAROSCOPIC BILATERAL TUBAL LIGATION USING ELECTROCAUTERY;  Surgeon: Lazaro Arms, MD;  Location: [facility];  Service: Gynecology;  Laterality: Bilateral;    Social History   Socioeconomic History   Marital status: Single    Spouse name: Not on file   Number of children: 3   Years of education: Not on file   Highest education level: Not on file  Occupational History   Not on file  Tobacco Use   Smoking status: Some Days    Current packs/day: 0.50    Average packs/day: 0.5 packs/day for 3.0 years (1.5 ttl pk-yrs)    Types: Cigarettes   Smokeless tobacco: Never  Vaping Use   Vaping status: Never Used  Substance and Sexual Activity   Alcohol use: No   Drug use: Yes    Types: Marijuana    Comment: every day   Sexual activity: Yes    Birth control/protection: Pill  Other Topics Concern   Not on file  Social History Narrative   Not on file   Social Determinants of Health   Financial Resource Strain: Not on file  Food Insecurity: Not on file  Transportation Needs: Not on file  Physical Activity: Not on file  Stress: Not on file  Social Connections: Not on file  Intimate Partner Violence: Not on file    Outpatient Encounter Medications  as of 07/29/2023  Medication Sig   escitalopram (LEXAPRO) 10 MG tablet Take 1 tablet (10 mg total) by mouth daily.   hydrOXYzine (ATARAX) 10 MG tablet Take 1 tablet (10 mg total) by mouth 3 (three) times daily as needed.   Semaglutide-Weight Management 0.25 MG/0.5ML SOAJ Inject 0.25 mg into the skin once a week.   Vitamin D, Ergocalciferol, (DRISDOL) 1.25 MG (50000 UNIT) CAPS capsule Take 1 capsule (50,000 Units total) by mouth every 7 (seven) days.   [DISCONTINUED]  amoxicillin-clavulanate (AUGMENTIN) 875-125 MG tablet Take 1 tablet by mouth 2 (two) times daily.   [DISCONTINUED] Naltrexone-buPROPion HCl ER (CONTRAVE) 8-90 MG TB12 Take 2 tablets by mouth 2 (two) times daily.   No facility-administered encounter medications on file as of 07/29/2023.    No Known Allergies  Pertinent ROS per HPI, otherwise unremarkable      Objective:  BP 118/79   Pulse 86   Temp (!) 97.4 F (36.3 C) (Temporal)   Ht 5\' 10"  (1.778 m)   Wt 251 lb 3.2 oz (113.9 kg)   LMP 07/15/2023   SpO2 100%   BMI 36.04 kg/m    Wt Readings from Last 3 Encounters:  07/29/23 251 lb 3.2 oz (113.9 kg)  05/20/23 229 lb 3.2 oz (104 kg)  01/29/23 226 lb 3.2 oz (102.6 kg)    Physical Exam Vitals and nursing note reviewed.  Constitutional:      Appearance: Normal appearance.  HENT:     Head: Normocephalic and atraumatic.     Nose: Nose normal.     Mouth/Throat:     Pharynx: Oropharynx is clear.  Eyes:     Conjunctiva/sclera: Conjunctivae normal.     Pupils: Pupils are equal, round, and reactive to light.  Cardiovascular:     Rate and Rhythm: Normal rate and regular rhythm.     Heart sounds: Normal heart sounds.  Pulmonary:     Effort: Pulmonary effort is normal.     Breath sounds: Normal breath sounds.  Musculoskeletal:     Cervical back: Normal range of motion and neck supple.     Right lower leg: No edema.     Left lower leg: No edema.  Skin:    General: Skin is warm and dry.     Capillary Refill: Capillary refill takes less than 2 seconds.  Neurological:     General: No focal deficit present.     Mental Status: She is alert and oriented to person, place, and time.  Psychiatric:        Mood and Affect: Mood normal.        Behavior: Behavior normal.        Thought Content: Thought content normal.        Judgment: Judgment normal.    Physical Exam   MEASUREMENTS: WT- 251        Results for orders placed or performed in visit on 05/20/23  CMP14+EGFR   Result Value Ref Range   Glucose 94 70 - 99 mg/dL   BUN 11 6 - 20 mg/dL   Creatinine, Ser 6.64 0.57 - 1.00 mg/dL   eGFR 83 >40 HK/VQQ/5.95   BUN/Creatinine Ratio 12 9 - 23   Sodium 136 134 - 144 mmol/L   Potassium 4.5 3.5 - 5.2 mmol/L   Chloride 103 96 - 106 mmol/L   CO2 22 20 - 29 mmol/L   Calcium 8.7 8.7 - 10.2 mg/dL   Total Protein 7.0 6.0 - 8.5 g/dL   Albumin 4.2  3.9 - 4.9 g/dL   Globulin, Total 2.8 1.5 - 4.5 g/dL   Bilirubin Total 0.2 0.0 - 1.2 mg/dL   Alkaline Phosphatase 106 44 - 121 IU/L   AST 17 0 - 40 IU/L   ALT 12 0 - 32 IU/L  CBC with Differential/Platelet  Result Value Ref Range   WBC 2.7 (L) 3.4 - 10.8 x10E3/uL   RBC 4.13 3.77 - 5.28 x10E6/uL   Hemoglobin 12.6 11.1 - 15.9 g/dL   Hematocrit 21.3 08.6 - 46.6 %   MCV 91 79 - 97 fL   MCH 30.5 26.6 - 33.0 pg   MCHC 33.4 31.5 - 35.7 g/dL   RDW 57.8 46.9 - 62.9 %   Platelets 255 150 - 450 x10E3/uL   Neutrophils 35 Not Estab. %   Lymphs 45 Not Estab. %   Monocytes 13 Not Estab. %   Eos 6 Not Estab. %   Basos 1 Not Estab. %   Neutrophils Absolute 1.0 (L) 1.4 - 7.0 x10E3/uL   Lymphocytes Absolute 1.2 0.7 - 3.1 x10E3/uL   Monocytes Absolute 0.4 0.1 - 0.9 x10E3/uL   EOS (ABSOLUTE) 0.2 0.0 - 0.4 x10E3/uL   Basophils Absolute 0.0 0.0 - 0.2 x10E3/uL   Immature Granulocytes 0 Not Estab. %   Immature Grans (Abs) 0.0 0.0 - 0.1 x10E3/uL  Thyroid Panel With TSH  Result Value Ref Range   TSH 0.649 0.450 - 4.500 uIU/mL   T4, Total 7.1 4.5 - 12.0 ug/dL   T3 Uptake Ratio 25 24 - 39 %   Free Thyroxine Index 1.8 1.2 - 4.9  VITAMIN D 25 Hydroxy (Vit-D Deficiency, Fractures)  Result Value Ref Range   Vit D, 25-Hydroxy 36.5 30.0 - 100.0 ng/mL  Lipid panel  Result Value Ref Range   Cholesterol, Total 163 100 - 199 mg/dL   Triglycerides 528 0 - 149 mg/dL   HDL 38 (L) >41 mg/dL   VLDL Cholesterol Cal 26 5 - 40 mg/dL   LDL Chol Calc (NIH) 99 0 - 99 mg/dL   Chol/HDL Ratio 4.3 0.0 - 4.4 ratio  Bayer DCA Hb A1c Waived  Result  Value Ref Range   HB A1C (BAYER DCA - WAIVED) 4.8 4.8 - 5.6 %       Pertinent labs & imaging results that were available during my care of the patient were reviewed by me and considered in my medical decision making.  Assessment & Plan:  Nakera was seen today for bmi.  Diagnoses and all orders for this visit:  Morbid obesity (HCC) -     Semaglutide-Weight Management 0.25 MG/0.5ML SOAJ; Inject 0.25 mg into the skin once a week.  Encounter for weight management -     Semaglutide-Weight Management 0.25 MG/0.5ML SOAJ; Inject 0.25 mg into the skin once a week.     Assessment and Plan    Obesity Weight gain despite Contrave use and initiation of exercise regimen. No side effects from Contrave, but it does not suppress appetite effectively. Previously used [place] with success. -Discontinue Contrave. -Initiate compounded injectable medication similar to [facility] through [facility]. -Fax order to [facility] with escalating dosing instructions. -Follow-up appointment in 8 weeks after medication initiation.  Headaches New onset, mild, inconsistent headaches since starting Nature Made fish oil. No known correlation between omega-3 supplements and headaches. Possible stress-related or eye strain. -Increase water intake. -Avoid NSAIDs due to risk of rebound headaches. -Consider Excedrin Tension for headache relief. -Schedule eye examination. -Notify provider if headaches persist or  worsen.  Follow-up appointment Current appointment scheduled for 08/19/2023. -Reschedule to around 09/25/2023, one month after starting new medication.          Continue all other maintenance medications.  Follow up plan: Return in about 8 weeks (around 09/23/2023), or if symptoms worsen or fail to improve, for BMI.   Continue healthy lifestyle choices, including diet (rich in fruits, vegetables, and lean proteins, and low in salt and simple carbohydrates) and exercise (at least 30 minutes of moderate  physical activity daily).  Educational handout given for calorie counting for weight management   The above assessment and management plan was discussed with the patient. The patient verbalized understanding of and has agreed to the management plan. Patient is aware to call the clinic if they develop any new symptoms or if symptoms persist or worsen. Patient is aware when to return to the clinic for a follow-up visit. Patient educated on when it is appropriate to go to the emergency department.   Kari Baars, FNP-C [facility] [phone]

## 2023-08-14 ENCOUNTER — Ambulatory Visit: Payer: Commercial Managed Care - PPO | Admitting: Podiatry

## 2023-08-17 ENCOUNTER — Encounter: Payer: Self-pay | Admitting: Family Medicine

## 2023-08-21 ENCOUNTER — Ambulatory Visit: Payer: Commercial Managed Care - PPO | Admitting: Podiatry

## 2023-08-21 ENCOUNTER — Ambulatory Visit: Payer: Commercial Managed Care - PPO | Admitting: Family Medicine

## 2023-08-30 ENCOUNTER — Telehealth: Payer: Commercial Managed Care - PPO | Admitting: Nurse Practitioner

## 2023-08-30 DIAGNOSIS — M545 Low back pain, unspecified: Secondary | ICD-10-CM | POA: Diagnosis not present

## 2023-08-30 MED ORDER — IBUPROFEN 600 MG PO TABS
600.0000 mg | ORAL_TABLET | Freq: Three times a day (TID) | ORAL | 0 refills | Status: AC | PRN
Start: 2023-08-30 — End: ?
  Filled 2023-08-30: qty 30, 10d supply, fill #0

## 2023-08-30 MED ORDER — CYCLOBENZAPRINE HCL 10 MG PO TABS
10.0000 mg | ORAL_TABLET | Freq: Three times a day (TID) | ORAL | 0 refills | Status: DC | PRN
Start: 2023-08-30 — End: 2023-10-20
  Filled 2023-08-30: qty 30, 10d supply, fill #0

## 2023-08-30 NOTE — Progress Notes (Signed)
I have spent 5 minutes in review of e-visit questionnaire, review and updating patient chart, medical decision making and response to patient.  ° °Jerrell Mangel W Secilia Apps, NP ° °  °

## 2023-08-30 NOTE — Progress Notes (Signed)

## 2023-08-31 ENCOUNTER — Other Ambulatory Visit (HOSPITAL_COMMUNITY): Payer: Self-pay

## 2023-09-28 ENCOUNTER — Encounter: Payer: Self-pay | Admitting: Family Medicine

## 2023-10-08 ENCOUNTER — Other Ambulatory Visit (HOSPITAL_COMMUNITY): Payer: Self-pay

## 2023-10-09 ENCOUNTER — Other Ambulatory Visit (HOSPITAL_COMMUNITY): Payer: Self-pay

## 2023-10-20 ENCOUNTER — Ambulatory Visit (INDEPENDENT_AMBULATORY_CARE_PROVIDER_SITE_OTHER): Payer: Commercial Managed Care - PPO | Admitting: Family Medicine

## 2023-10-20 ENCOUNTER — Encounter: Payer: Self-pay | Admitting: Family Medicine

## 2023-10-20 ENCOUNTER — Other Ambulatory Visit (HOSPITAL_COMMUNITY)
Admission: RE | Admit: 2023-10-20 | Discharge: 2023-10-20 | Disposition: A | Discharge status: Home / Self Care | Payer: Commercial Managed Care - PPO | Source: Ambulatory Visit | Attending: Family Medicine | Admitting: Family Medicine

## 2023-10-20 ENCOUNTER — Other Ambulatory Visit (HOSPITAL_COMMUNITY): Payer: Self-pay

## 2023-10-20 VITALS — BP 120/84 | HR 93 | Temp 97.2°F | Ht 70.0 in | Wt 270.0 lb

## 2023-10-20 DIAGNOSIS — F419 Anxiety disorder, unspecified: Secondary | ICD-10-CM

## 2023-10-20 DIAGNOSIS — Z1159 Encounter for screening for other viral diseases: Secondary | ICD-10-CM

## 2023-10-20 DIAGNOSIS — Z0000 Encounter for general adult medical examination without abnormal findings: Secondary | ICD-10-CM | POA: Diagnosis not present

## 2023-10-20 DIAGNOSIS — F339 Major depressive disorder, recurrent, unspecified: Secondary | ICD-10-CM

## 2023-10-20 DIAGNOSIS — Z124 Encounter for screening for malignant neoplasm of cervix: Secondary | ICD-10-CM | POA: Insufficient documentation

## 2023-10-20 DIAGNOSIS — Z01411 Encounter for gynecological examination (general) (routine) with abnormal findings: Secondary | ICD-10-CM

## 2023-10-20 DIAGNOSIS — E559 Vitamin D deficiency, unspecified: Secondary | ICD-10-CM

## 2023-10-20 MED ORDER — ESCITALOPRAM OXALATE 20 MG PO TABS
20.0000 mg | ORAL_TABLET | Freq: Every day | ORAL | 2 refills | Status: DC
  Filled 2023-10-20: qty 90, 90d supply, fill #0
  Filled 2024-01-25: qty 90, 90d supply, fill #1
  Filled 2024-05-20 – 2024-07-05 (×2): qty 90, 90d supply, fill #2

## 2023-10-20 MED ORDER — HYDROXYZINE HCL 10 MG PO TABS
10.0000 mg | ORAL_TABLET | Freq: Three times a day (TID) | ORAL | 3 refills | Status: DC | PRN
  Filled 2023-10-20: qty 30, 10d supply, fill #0
  Filled 2024-01-25: qty 30, 10d supply, fill #1
  Filled 2024-03-15 – 2024-10-20 (×3): qty 30, 10d supply, fill #2

## 2023-10-20 NOTE — Progress Notes (Signed)
 Complete physical exam  Patient: Catherine Davis   DOB: 08-06-1986   38 y.o. Female  MRN: [id number]  Subjective:    Chief Complaint  Patient presents with   Gynecologic Exam    Catherine Davis is a 38 y.o. female who presents today for a complete physical exam. She reports consuming a general diet. The patient does not participate in regular exercise at present. She generally feels well. She reports sleeping well. She does not have additional problems to discuss today.   Discussed the use of AI scribe software for clinical note transcription with the patient, who gave verbal consent to proceed.  History of Present Illness   The patient, with a history of hyperlipidemia, hypertension, and obesity, presents for a routine physical. She expresses dissatisfaction with the discontinuation of her weight loss medication, noting a negative impact on her health. She is hopeful for the reintroduction of this medication in the future.  In terms of mental health, the patient reports increased irritability and feeling on edge for several weeks. She is currently on Lexapro , which she feels may not be working as effectively as before. She speculates that this could be due to situational stressors, such as her eighteen-year-old daughter moving back home. She denies any side effects from the medication.  The patient has completed three rounds of the COVID vaccine. She reports no changes in hearing or vision, and she has regular bowel and bladder habits. She denies any significant joint pain, nocturia, shortness of breath, chest pain, or leg swelling. She has no family history of breast or colorectal cancer.  The patient's last dental cleaning was in October, and she reports no issues with her oral health. She has not been to an eye doctor before. She also mentions that she has been eating before coming to the clinic.       Most recent fall risk assessment:    07/29/2023    2:26 PM  Fall Risk   Falls in  the past year? 0     Most recent depression screenings:    10/20/2023    1:58 PM 07/29/2023    2:26 PM 05/20/2023   10:03 AM 01/29/2023   11:57 AM 12/11/2022    3:19 PM  Depression screen PHQ 2/9  Decreased Interest 1 1 1 1 1   Down, Depressed, Hopeless 0 0 1 0 0  PHQ - 2 Score 1 1 2 1 1   Altered sleeping 0 0 2 0 0  Tired, decreased energy 0 2 2 1  0  Change in appetite 1 0 2 2 0  Feeling bad or failure about yourself  0 0 1 0 0  Trouble concentrating 0 0 0 0 0  Moving slowly or fidgety/restless 0 0 0 0 0  Suicidal thoughts 0 0 0 0 0  PHQ-9 Score 2 3 9 4 1   Difficult doing work/chores Not difficult at all Not difficult at all Somewhat difficult Not difficult at all Not difficult at all      10/20/2023    1:58 PM 07/29/2023    2:26 PM 05/20/2023   10:04 AM 01/29/2023   11:58 AM  GAD 7 : Generalized Anxiety Score  Nervous, Anxious, on Edge 2 1 2 1   Control/stop worrying 1 1 2 1   Worry too much - different things 1 1 2 1   Trouble relaxing 2 0 0 0  Restless 0 0 0 0  Easily annoyed or irritable 2 2 2  0  Afraid - awful might  happen 1 0 0 0  Total GAD 7 Score 9 5 8 3   Anxiety Difficulty Somewhat difficult Not difficult at all Somewhat difficult Not difficult at all      Vision:Not within last year  and Dental: No current dental problems and Receives regular dental care  Patient Active Problem List   Diagnosis Date Noted   Vitamin D  deficiency 12/11/2022   Anxiety 08/25/2022   Depression, recurrent (HCC) 08/25/2022   Morbid obesity (HCC) 02/21/2022   History reviewed. No pertinent past medical history. Past Surgical History:  Procedure Laterality Date   ECTOPIC PREGNANCY SURGERY     LAPAROSCOPIC TUBAL LIGATION Bilateral 06/29/2019   Procedure: LAPAROSCOPIC BILATERAL TUBAL LIGATION USING ELECTROCAUTERY;  Surgeon: Jayne Vonn DEL, MD;  Location: [facility];  Service: Gynecology;  Laterality: Bilateral;   Social History   Tobacco Use   Smoking status: Some Days    Current  packs/day: 0.50    Average packs/day: 0.5 packs/day for 3.0 years (1.5 ttl pk-yrs)    Types: Cigarettes   Smokeless tobacco: Never  Vaping Use   Vaping status: Never Used  Substance Use Topics   Alcohol use: No   Drug use: Yes    Types: Marijuana    Comment: every day   Social History   Socioeconomic History   Marital status: Single    Spouse name: Not on file   Number of children: 3   Years of education: Not on file   Highest education level: Not on file  Occupational History   Not on file  Tobacco Use   Smoking status: Some Days    Current packs/day: 0.50    Average packs/day: 0.5 packs/day for 3.0 years (1.5 ttl pk-yrs)    Types: Cigarettes   Smokeless tobacco: Never  Vaping Use   Vaping status: Never Used  Substance and Sexual Activity   Alcohol use: No   Drug use: Yes    Types: Marijuana    Comment: every day   Sexual activity: Yes    Birth control/protection: Pill  Other Topics Concern   Not on file  Social History Narrative   Not on file   Social Drivers of Health   Financial Resource Strain: Not on file  Food Insecurity: Not on file  Transportation Needs: Not on file  Physical Activity: Not on file  Stress: Not on file  Social Connections: Not on file  Intimate Partner Violence: Not on file   Family Status  Relation Name Status   Mother  Alive   Father  Alive   Sister  Alive   Sister  Alive   Brother  Alive   Brother  Alive   Brother  Alive   Daughter  Alive   Son  Alive   Son  Alive   MGM  Deceased   MGF  Deceased   PGM  Alive   PGF  Deceased   Other  (Not Specified)  No partnership data on file   Family History  Problem Relation Age of Onset   Hypertension Mother    Diabetes Father    Cancer Maternal Grandmother        GI cancer   Cirrhosis Maternal Grandfather    Diabetes Paternal Grandmother    Diabetes Paternal Grandfather    Diabetes Other    No Known Allergies    Patient Care Team: Thos Matsumoto, Rock HERO, FNP as PCP -  General (Family Medicine)   Outpatient Medications Prior to Visit  Medication Sig   ibuprofen  (  ADVIL ) 600 MG tablet Take 1 tablet (600 mg total) by mouth every 8 (eight) hours as needed.   Semaglutide -Weight Management 0.25 MG/0.5ML SOAJ Inject 0.25 mg into the skin once a week.   [DISCONTINUED] escitalopram  (LEXAPRO ) 10 MG tablet Take 1 tablet (10 mg total) by mouth daily.   [DISCONTINUED] hydrOXYzine  (ATARAX ) 10 MG tablet Take 1 tablet (10 mg total) by mouth 3 (three) times daily as needed.   [DISCONTINUED] cyclobenzaprine  (FLEXERIL ) 10 MG tablet Take 1 tablet (10 mg total) by mouth 3 (three) times daily as needed for muscle spasms.   [DISCONTINUED] Vitamin D , Ergocalciferol , (DRISDOL ) 1.25 MG (50000 UNIT) CAPS capsule Take 1 capsule (50,000 Units total) by mouth every 7 (seven) days.   No facility-administered medications prior to visit.    ROS per HPI      Objective:     BP 120/84   Pulse 93   Temp (!) 97.2 F (36.2 C)   Ht 5' 10 (1.778 m)   Wt 270 lb (122.5 kg)   SpO2 97%   BMI 38.74 kg/m  BP Readings from Last 3 Encounters:  10/20/23 120/84  07/29/23 118/79  05/20/23 125/87   Wt Readings from Last 3 Encounters:  10/20/23 270 lb (122.5 kg)  07/29/23 251 lb 3.2 oz (113.9 kg)  05/20/23 229 lb 3.2 oz (104 kg)   SpO2 Readings from Last 3 Encounters:  10/20/23 97%  07/29/23 100%  05/20/23 99%      Physical Exam Vitals and nursing note reviewed. Exam conducted with a chaperone present.  Constitutional:      General: She is not in acute distress.    Appearance: Normal appearance. She is well-developed and well-groomed. She is obese. She is not ill-appearing, toxic-appearing or diaphoretic.  HENT:     Head: Normocephalic and atraumatic.     Jaw: There is normal jaw occlusion.     Right Ear: Hearing, tympanic membrane, ear canal and external ear normal.     Left Ear: Hearing, tympanic membrane, ear canal and external ear normal.     Nose: Nose normal.      Mouth/Throat:     Lips: Pink.     Mouth: Mucous membranes are moist.     Pharynx: Oropharynx is clear. Uvula midline.  Eyes:     General: Lids are normal.     Extraocular Movements: Extraocular movements intact.     Conjunctiva/sclera: Conjunctivae normal.     Pupils: Pupils are equal, round, and reactive to light.  Neck:     Thyroid : No thyroid  mass, thyromegaly or thyroid  tenderness.     Vascular: No carotid bruit or JVD.     Trachea: Trachea and phonation normal.  Cardiovascular:     Rate and Rhythm: Normal rate and regular rhythm.     Chest Geisler: PMI is not displaced.     Pulses: Normal pulses.     Heart sounds: Normal heart sounds. No murmur heard.    No friction rub. No gallop.  Pulmonary:     Effort: Pulmonary effort is normal. No respiratory distress.     Breath sounds: Normal breath sounds. No wheezing.  Abdominal:     General: Bowel sounds are normal. There is no distension or abdominal bruit.     Palpations: Abdomen is soft. There is no hepatomegaly or splenomegaly.     Tenderness: There is no abdominal tenderness. There is no right CVA tenderness or left CVA tenderness.     Hernia: No hernia is present. There is no  hernia in the left inguinal area or right inguinal area.  Genitourinary:    Exam position: Lithotomy position.     Pubic Area: No rash or pubic lice.      Tanner stage (genital): 5.     Labia:        Right: No rash, tenderness, lesion or injury.        Left: No rash, tenderness, lesion or injury.      Urethra: No prolapse.     Vagina: Normal.     Cervix: Normal.     Uterus: Normal.      Adnexa: Right adnexa normal and left adnexa normal.     Rectum: Normal.  Musculoskeletal:        General: Normal range of motion.     Cervical back: Normal range of motion and neck supple.     Right lower leg: No edema.     Left lower leg: No edema.  Lymphadenopathy:     Cervical: No cervical adenopathy.     Lower Body: No right inguinal adenopathy. No left  inguinal adenopathy.  Skin:    General: Skin is warm and dry.     Capillary Refill: Capillary refill takes less than 2 seconds.     Coloration: Skin is not cyanotic, jaundiced or pale.     Findings: No rash.  Neurological:     General: No focal deficit present.     Mental Status: She is alert and oriented to person, place, and time.     Sensory: Sensation is intact.     Motor: Motor function is intact.     Coordination: Coordination is intact.     Gait: Gait is intact.     Deep Tendon Reflexes: Reflexes are normal and symmetric.  Psychiatric:        Attention and Perception: Attention and perception normal.        Mood and Affect: Mood and affect normal.        Speech: Speech normal.        Behavior: Behavior normal. Behavior is cooperative.        Thought Content: Thought content normal.        Cognition and Memory: Cognition and memory normal.        Judgment: Judgment normal.      Last CBC Lab Results  Component Value Date   WBC 2.7 (L) 05/20/2023   HGB 12.6 05/20/2023   HCT 37.7 05/20/2023   MCV 91 05/20/2023   MCH 30.5 05/20/2023   RDW 11.9 05/20/2023   PLT 255 05/20/2023   Last metabolic panel Lab Results  Component Value Date   GLUCOSE 94 05/20/2023   NA 136 05/20/2023   K 4.5 05/20/2023   CL 103 05/20/2023   CO2 22 05/20/2023   BUN 11 05/20/2023   CREATININE 0.92 05/20/2023   EGFR 83 05/20/2023   CALCIUM 8.7 05/20/2023   PROT 7.0 05/20/2023   ALBUMIN 4.2 05/20/2023   LABGLOB 2.8 05/20/2023   AGRATIO 1.5 01/29/2023   BILITOT 0.2 05/20/2023   ALKPHOS 106 05/20/2023   AST 17 05/20/2023   ALT 12 05/20/2023   ANIONGAP 9 06/27/2019   Last lipids Lab Results  Component Value Date   CHOL 163 05/20/2023   HDL 38 (L) 05/20/2023   LDLCALC 99 05/20/2023   TRIG 147 05/20/2023   CHOLHDL 4.3 05/20/2023   Last hemoglobin A1c Lab Results  Component Value Date   HGBA1C 4.8 05/20/2023   Last thyroid  functions Lab Results  Component Value Date   TSH  0.649 05/20/2023   T4TOTAL 7.1 05/20/2023   Last vitamin D  Lab Results  Component Value Date   VD25OH 36.5 05/20/2023    Assessment & Plan:    Routine Health Maintenance and Physical Exam  Immunization History  Administered Date(s) Administered   Influenza,inj,Quad PF,6+ Mos 08/17/2020   Influenza-Unspecified 07/03/2022   PFIZER(Purple Top)SARS-COV-2 Vaccination 05/11/2020, 06/01/2020, 11/02/2020   Td 03/06/2020    Health Maintenance  Topic Date Due   Hepatitis C Screening  Never done   Cervical Cancer Screening (HPV/Pap Cotest)  Never done   COVID-19 Vaccine (4 - 2024-25 season) 11/05/2023 (Originally 06/07/2023)   INFLUENZA VACCINE  01/04/2024 (Originally 05/07/2023)   Pneumococcal Vaccine 45-38 Years old (1 of 2 - PCV) 10/19/2024 (Originally 06/23/1993)   DTaP/Tdap/Td (2 - Tdap) 03/06/2030   HIV Screening  Completed   HPV VACCINES  Aged Out    Discussed health benefits of physical activity, and encouraged her to engage in regular exercise appropriate for her age and condition.  Problem List Items Addressed This Visit       Other   Morbid obesity (HCC)   Relevant Orders   Lipid panel   CBC with Differential/Platelet   CMP14+EGFR   VITAMIN D  25 Hydroxy (Vit-D Deficiency, Fractures)   Thyroid  Panel With TSH   Anxiety   Relevant Medications   hydrOXYzine  (ATARAX ) 10 MG tablet   escitalopram  (LEXAPRO ) 20 MG tablet   Other Relevant Orders   Thyroid  Panel With TSH   Depression, recurrent (HCC)   Relevant Medications   hydrOXYzine  (ATARAX ) 10 MG tablet   escitalopram  (LEXAPRO ) 20 MG tablet   Other Relevant Orders   Thyroid  Panel With TSH   Vitamin D  deficiency   Relevant Orders   CMP14+EGFR   VITAMIN D  25 Hydroxy (Vit-D Deficiency, Fractures)   Other Visit Diagnoses       Annual physical exam    -  Primary   Relevant Orders   Hepatitis C Antibody   Lipid panel   CBC with Differential/Platelet   CMP14+EGFR   Thyroid  Panel With TSH   Cytology - PAP([facility])     Encounter for hepatitis C screening test for low risk patient       Relevant Orders   Hepatitis C Antibody     Cervical cancer screening       Relevant Orders   Cytology - PAP([place])     Assessment and Plan    Major Depressive Disorder   Increased irritability and feeling on edge for weeks, likely situational due to stress from her daughter moving back home. Currently on Lexapro  10 mg with no side effects. Discussed increasing the dose to 20 mg to better manage symptoms.   - Increase Lexapro  to 20 mg    Hypertension   Hypertension discussed as part of cardiovascular disease risk management. Emphasized the importance of weight management in controlling hypertension.   - Monitor blood pressure    Hyperlipidemia   Current cholesterol levels to be assessed with today's lab work. Discussed the importance of managing cholesterol levels to reduce cardiovascular disease risk. Emphasized the need for weight management to aid in controlling hyperlipidemia.   - Order lipid panel    Obesity   Desires to resume weight loss medication (GOP-1). Discussed challenges with current approval and cost issues. Emphasized the importance of weight management for overall health. Discussed potential future options for weight loss medication if approval status changes.   - Monitor  weight   - Discuss potential future options for weight loss medication if approval status changes    General Health Maintenance   Routine physical examination. Completed three COVID-19 vaccinations but has not received any more. No changes in hearing or vision. Regular dental cleanings, last in October. No significant joint pain, nocturia, shortness of breath, chest pain, or leg swelling. No family history of breast or colorectal cancer. Discussed starting mammograms at age 39 and colorectal cancer screening at age 79 with Cologuard or colonoscopy.   - Perform Pap smear   - Order lab work   - Postpone further  COVID-19 vaccination   - Recommend regular eye exams   - Start mammograms at age 30   - Start colorectal cancer screening at age 35 with Cologuard or colonoscopy.       Return in about 1 year (around 10/19/2024), or if symptoms worsen or fail to improve, for Annual Physical and chronic follow up.     [name], FNP

## 2023-10-21 ENCOUNTER — Other Ambulatory Visit (HOSPITAL_COMMUNITY): Payer: Self-pay

## 2023-10-21 LAB — CMP14+EGFR
ALT: 15 [IU]/L (ref 0–32)
AST: 17 [IU]/L (ref 0–40)
Albumin: 4.7 g/dL (ref 3.9–4.9)
Alkaline Phosphatase: 93 [IU]/L (ref 44–121)
BUN/Creatinine Ratio: 12 (ref 9–23)
BUN: 11 mg/dL (ref 6–20)
Bilirubin Total: 0.3 mg/dL (ref 0.0–1.2)
CO2: 18 mmol/L — ABNORMAL LOW (ref 20–29)
Calcium: 9.8 mg/dL (ref 8.7–10.2)
Chloride: 103 mmol/L (ref 96–106)
Creatinine, Ser: 0.9 mg/dL (ref 0.57–1.00)
Globulin, Total: 3 g/dL (ref 1.5–4.5)
Glucose: 100 mg/dL — ABNORMAL HIGH (ref 70–99)
Potassium: 4.2 mmol/L (ref 3.5–5.2)
Sodium: 138 mmol/L (ref 134–144)
Total Protein: 7.7 g/dL (ref 6.0–8.5)
eGFR: 85 mL/min/{1.73_m2} (ref >59)

## 2023-10-21 LAB — CBC WITH DIFFERENTIAL/PLATELET
Basophils Absolute: 0 10*3/uL (ref 0.0–0.2)
Basos: 0 %
EOS (ABSOLUTE): 0.4 10*3/uL (ref 0.0–0.4)
Eos: 8 %
Hematocrit: 41.1 % (ref 34.0–46.6)
Hemoglobin: 13.4 g/dL (ref 11.1–15.9)
Immature Grans (Abs): 0 10*3/uL (ref 0.0–0.1)
Immature Granulocytes: 0 %
Lymphocytes Absolute: 1.8 10*3/uL (ref 0.7–3.1)
Lymphs: 36 %
MCH: 29.8 pg (ref 26.6–33.0)
MCHC: 32.6 g/dL (ref 31.5–35.7)
MCV: 92 fL (ref 79–97)
Monocytes Absolute: 0.4 10*3/uL (ref 0.1–0.9)
Monocytes: 9 %
Neutrophils Absolute: 2.3 10*3/uL (ref 1.4–7.0)
Neutrophils: 47 %
Platelets: 322 10*3/uL (ref 150–450)
RBC: 4.49 x10E6/uL (ref 3.77–5.28)
RDW: 11.9 % (ref 11.7–15.4)
WBC: 4.9 10*3/uL (ref 3.4–10.8)

## 2023-10-21 LAB — VITAMIN D 25 HYDROXY (VIT D DEFICIENCY, FRACTURES): Vit D, 25-Hydroxy: 17.3 ng/mL — ABNORMAL LOW (ref 30.0–100.0)

## 2023-10-21 LAB — LIPID PANEL
Chol/HDL Ratio: 4.7 {ratio} — ABNORMAL HIGH (ref 0.0–4.4)
Cholesterol, Total: 189 mg/dL (ref 100–199)
HDL: 40 mg/dL (ref >39)
LDL Chol Calc (NIH): 123 mg/dL — ABNORMAL HIGH (ref 0–99)
Triglycerides: 145 mg/dL (ref 0–149)
VLDL Cholesterol Cal: 26 mg/dL (ref 5–40)

## 2023-10-21 LAB — HEPATITIS C ANTIBODY: Hep C Virus Ab: NONREACTIVE

## 2023-10-21 LAB — THYROID PANEL WITH TSH
Free Thyroxine Index: 2.1 (ref 1.2–4.9)
T3 Uptake Ratio: 24 % (ref 24–39)
T4, Total: 8.7 ug/dL (ref 4.5–12.0)
TSH: 1.02 u[IU]/mL (ref 0.450–4.500)

## 2023-10-21 MED ORDER — VITAMIN D (ERGOCALCIFEROL) 1.25 MG (50000 UNIT) PO CAPS
50000.0000 [IU] | ORAL_CAPSULE | ORAL | 6 refills | Status: AC
  Filled 2023-10-21: qty 4, 28d supply, fill #0
  Filled 2023-11-14: qty 4, 28d supply, fill #1
  Filled 2024-01-08 – 2024-01-25 (×2): qty 4, 28d supply, fill #2
  Filled 2024-03-15 – 2024-07-05 (×3): qty 4, 28d supply, fill #3
  Filled 2024-08-12: qty 4, 28d supply, fill #4
  Filled 2024-09-22: qty 4, 28d supply, fill #5
  Filled 2024-10-16 – 2024-10-20 (×2): qty 4, 28d supply, fill #6

## 2023-10-21 NOTE — Addendum Note (Signed)
 Addended by: Galvin Jules on: 10/21/2023 07:54 AM   Modules accepted: Orders

## 2023-10-22 LAB — CYTOLOGY - PAP
Adequacy: ABSENT
Chlamydia: NEGATIVE
Comment: NEGATIVE
Comment: NEGATIVE
Comment: NEGATIVE
Comment: NORMAL
Diagnosis: NEGATIVE
High risk HPV: NEGATIVE
Neisseria Gonorrhea: NEGATIVE
Trichomonas: NEGATIVE

## 2024-01-20 ENCOUNTER — Other Ambulatory Visit (HOSPITAL_COMMUNITY): Payer: Self-pay

## 2024-03-22 ENCOUNTER — Telehealth: Admitting: Physician Assistant

## 2024-03-22 DIAGNOSIS — J029 Acute pharyngitis, unspecified: Secondary | ICD-10-CM

## 2024-03-23 MED ORDER — AMOXICILLIN 500 MG PO CAPS: 500.0000 mg | ORAL_CAPSULE | Freq: Two times a day (BID) | ORAL | 0 refills | Status: AC

## 2024-03-23 NOTE — Progress Notes (Signed)
E-Visit for Sore Throat - Strep Symptoms ? ?We are sorry that you are not feeling well.  Here is how we plan to help! ? ?Based on what you have shared with me it is likely that you have strep pharyngitis.  Strep pharyngitis is inflammation and infection in the back of the throat.  This is an infection cause by bacteria and is treated with antibiotics.  I have prescribed Amoxicillin 500 mg twice a day for 10 days. For throat pain, we recommend over the counter oral pain relief medications such as acetaminophen or aspirin, or anti-inflammatory medications such as ibuprofen or naproxen sodium. Topical treatments such as oral throat lozenges or sprays may be used as needed. Strep infections are not as easily transmitted as other respiratory infections, however we still recommend that you avoid close contact with loved ones, especially the very young and elderly.  Remember to wash your hands thoroughly throughout the day as this is the number one way to prevent the spread of infection and wipe down door knobs and counters with disinfectant. ? ? ?Home Care: ?Only take medications as instructed by your medical team. ?Complete the entire course of an antibiotic. ?Do not take these medications with alcohol. ?A steam or ultrasonic humidifier can help congestion.  You can place a towel over your head and breathe in the steam from hot water coming from a faucet. ?Avoid close contacts especially the very young and the elderly. ?Cover your mouth when you cough or sneeze. ?Always remember to wash your hands. ? ?Get Help Right Away If: ?You develop worsening fever or sinus pain. ?You develop a severe head ache or visual changes. ?Your symptoms persist after you have completed your treatment plan. ? ?Make sure you ?Understand these instructions. ?Will watch your condition. ?Will get help right away if you are not doing well or get worse. ? ? ?Thank you for choosing an e-visit. ? ?Your e-visit answers were reviewed by a board  certified advanced clinical practitioner to complete your personal care plan. Depending upon the condition, your plan could have included both over the counter or prescription medications. ? ?Please review your pharmacy choice. Make sure the pharmacy is open so you can pick up prescription now. If there is a problem, you may contact your provider through MyChart messaging and have the prescription routed to another pharmacy.  Your safety is important to us. If you have drug allergies check your prescription carefully.  ? ?For the next 24 hours you can use MyChart to ask questions about today's visit, request a non-urgent call back, or ask for a work or school excuse. ?You will get an email in the next two days asking about your experience. I hope that your e-visit has been valuable and will speed your recovery. ? ?Approximately 5 minutes was spent documenting and reviewing patient's chart.  ? ? ?

## 2024-03-28 ENCOUNTER — Other Ambulatory Visit (HOSPITAL_COMMUNITY): Payer: Self-pay

## 2024-05-25 ENCOUNTER — Encounter: Payer: Self-pay | Admitting: Pharmacist

## 2024-05-25 ENCOUNTER — Other Ambulatory Visit: Payer: Self-pay

## 2024-05-30 ENCOUNTER — Other Ambulatory Visit: Payer: Self-pay

## 2024-07-05 ENCOUNTER — Other Ambulatory Visit (HOSPITAL_COMMUNITY): Payer: Self-pay

## 2024-07-20 ENCOUNTER — Ambulatory Visit: Admitting: Family Medicine

## 2024-07-31 ENCOUNTER — Telehealth: Admitting: Physician Assistant

## 2024-07-31 DIAGNOSIS — J069 Acute upper respiratory infection, unspecified: Secondary | ICD-10-CM

## 2024-07-31 MED ORDER — PREDNISONE 20 MG PO TABS: 20.0000 mg | ORAL_TABLET | Freq: Every day | ORAL | 0 refills | Status: AC

## 2024-07-31 MED ORDER — ALBUTEROL SULFATE HFA 108 (90 BASE) MCG/ACT IN AERS: 2.0000 | INHALATION_SPRAY | Freq: Four times a day (QID) | RESPIRATORY_TRACT | 2 refills | Status: AC | PRN

## 2024-07-31 MED ORDER — PROMETHAZINE-DM 6.25-15 MG/5ML PO SYRP: 5.0000 mL | ORAL_SOLUTION | Freq: Four times a day (QID) | ORAL | 0 refills | Status: AC | PRN

## 2024-07-31 NOTE — Progress Notes (Signed)
 We are sorry that you are not feeling well.  Here is how we plan to help!  Based on your presentation I believe you most likely have A cough due to a virus.  This is called viral bronchitis and is best treated by rest, plenty of fluids and control of the cough.  You may use Ibuprofen  or Tylenol  as directed to help your symptoms.  I have prescribed a prescription strength cough medicine and steroids to help.  From your responses in the eVisit questionnaire you describe inflammation in the upper respiratory tract which is causing a significant cough.  This is commonly called Bronchitis and has four common causes:   Allergies Viral Infections Acid Reflux Bacterial Infection Allergies, viruses and acid reflux are treated by controlling symptoms or eliminating the cause. An example might be a cough caused by taking certain blood pressure medications. You stop the cough by changing the medication. Another example might be a cough caused by acid reflux. Controlling the reflux helps control the cough.  USE OF BRONCHODILATOR (RESCUE) INHALERS: There is a risk from using your bronchodilator too frequently.  The risk is that over-reliance on a medication which only relaxes the muscles surrounding the breathing tubes can reduce the effectiveness of medications prescribed to reduce swelling and congestion of the tubes themselves.  Although you feel brief relief from the bronchodilator inhaler, your asthma may actually be worsening with the tubes becoming more swollen and filled with mucus.  This can delay other crucial treatments, such as oral steroid medications. If you need to use a bronchodilator inhaler daily, several times per day, you should discuss this with your provider.  There are probably better treatments that could be used to keep your asthma under control.     HOME CARE Only take medications as instructed by your medical team. Complete the entire course of an antibiotic. Drink plenty of fluids  and get plenty of rest. Avoid close contacts especially the very young and the elderly Cover your mouth if you cough or cough into your sleeve. Always remember to wash your hands A steam or ultrasonic humidifier can help congestion.   GET HELP RIGHT AWAY IF: You develop worsening fever. You become short of breath You cough up blood. Your symptoms persist after you have completed your treatment plan MAKE SURE YOU  Understand these instructions. Will watch your condition. Will get help right away if you are not doing well or get worse.  Your e-visit answers were reviewed by a board certified advanced clinical practitioner to complete your personal care plan.  Depending on the condition, your plan could have included both over the counter or prescription medications. If there is a problem please reply  once you have received a response from your provider. Your safety is important to us .  If you have drug allergies check your prescription carefully.    You can use MyChart to ask questions about today's visit, request a non-urgent call back, or ask for a work or school excuse for 24 hours related to this e-Visit. If it has been greater than 24 hours you will need to follow up with your provider, or enter a new e-Visit to address those concerns. You will get an e-mail in the next two days asking about your experience.  I hope that your e-visit has been valuable and will speed your recovery. Thank you for using e-visits.   I have spent 5 minutes in review of e-visit questionnaire, review and updating patient chart, medical decision  making and response to patient.   Teena Shuck, PA-C

## 2024-08-05 ENCOUNTER — Telehealth (INDEPENDENT_AMBULATORY_CARE_PROVIDER_SITE_OTHER): Admitting: Family Medicine

## 2024-08-05 ENCOUNTER — Encounter: Payer: Self-pay | Admitting: Family Medicine

## 2024-08-05 DIAGNOSIS — J209 Acute bronchitis, unspecified: Secondary | ICD-10-CM

## 2024-08-05 DIAGNOSIS — R059 Cough, unspecified: Secondary | ICD-10-CM

## 2024-08-05 DIAGNOSIS — J40 Bronchitis, not specified as acute or chronic: Secondary | ICD-10-CM

## 2024-08-05 MED ORDER — HYDROCODONE BIT-HOMATROP MBR 5-1.5 MG/5ML PO SOLN: 5.0000 mL | Freq: Three times a day (TID) | ORAL | 0 refills | Status: AC | PRN

## 2024-08-05 MED ORDER — AZITHROMYCIN 500 MG PO TABS: 500.0000 mg | ORAL_TABLET | Freq: Every day | ORAL | 0 refills | Status: AC

## 2024-08-05 NOTE — Progress Notes (Signed)
 Virtual Visit via Video   I connected with patient on 08/05/24 at 0915 by a video enabled telemedicine application and verified that I am speaking with the correct person using two identifiers.  Location patient: Home Location provider: [facility] Persons participating in the virtual visit: Patient and Provider  I discussed the limitations of evaluation and management by telemedicine and the availability of in person appointments. The patient expressed understanding and agreed to proceed.  Subjective:   HPI:  Pt presents today for  Chief Complaint  Patient presents with   Cough   URI    Catherine Davis is a 37 year old female who presents with a persistent cough following an upper respiratory infection.  Cough and upper respiratory symptoms - Persistent cough following an upper respiratory infection, ongoing since earlier this week - Initial symptoms included fever lasting two to three days, resolved prior to this visit - Cough is severe, resulting in episodes of urinary incontinence and vomiting - Cough significantly disrupts sleep; unable to sleep due to coughing - No relief with over-the-counter and prescription treatments including [place], hot teas, honey, promethazine , prednisone , and albuterol  - Use of humidifier and other personal remedies without improvement  Recent lifestyle changes - Quit smoking approximately six to seven weeks ago - Stopped consuming caffeine        ROS per HPI   Patient Active Problem List   Diagnosis Date Noted   Vitamin D  deficiency 12/11/2022   Anxiety 08/25/2022   Depression, recurrent 08/25/2022   Morbid obesity (HCC) 02/21/2022    Social History   Tobacco Use   Smoking status: Former    Current packs/day: 0.00    Average packs/day: 0.5 packs/day for 3.0 years (1.5 ttl pk-yrs)    Types: Cigarettes    Quit date: 05/05/2024    Years since quitting: 0.2    Passive exposure: Past   Smokeless  tobacco: Never  Substance Use Topics   Alcohol use: No    Current Outpatient Medications:    azithromycin  (ZITHROMAX ) 500 MG tablet, Take 1 tablet (500 mg total) by mouth daily for 3 days., Disp: 3 tablet, Rfl: 0   HYDROcodone  bit-homatropine (HYCODAN) 5-1.5 MG/5ML syrup, Take 5 mLs by mouth every 8 (eight) hours as needed for cough., Disp: 120 mL, Rfl: 0   albuterol  (VENTOLIN  HFA) 108 (90 Base) MCG/ACT inhaler, Inhale 2 puffs into the lungs every 6 (six) hours as needed (cough)., Disp: 8 g, Rfl: 2   escitalopram  (LEXAPRO ) 20 MG tablet, Take 1 tablet (20 mg total) by mouth daily., Disp: 90 tablet, Rfl: 2   hydrOXYzine  (ATARAX ) 10 MG tablet, Take 1 tablet (10 mg total) by mouth 3 (three) times daily as needed., Disp: 30 tablet, Rfl: 3   ibuprofen  (ADVIL ) 600 MG tablet, Take 1 tablet (600 mg total) by mouth every 8 (eight) hours as needed., Disp: 30 tablet, Rfl: 0   predniSONE  (DELTASONE ) 20 MG tablet, Take 1 tablet (20 mg total) by mouth daily with breakfast for 5 days., Disp: 5 tablet, Rfl: 0   promethazine -dextromethorphan (PROMETHAZINE -DM) 6.25-15 MG/5ML syrup, Take 5 mLs by mouth 4 (four) times daily as needed for up to 5 days for cough., Disp: 100 mL, Rfl: 0   Semaglutide -Weight Management 0.25 MG/0.5ML SOAJ, Inject 0.25 mg into the skin once a week., Disp: , Rfl:    Vitamin D , Ergocalciferol , (DRISDOL ) 1.25 MG (50000 UNIT) CAPS capsule, Take 1 capsule (50,000 Units total) by mouth every 7 (seven) days., Disp:  4 capsule, Rfl: 6  No Known Allergies  Objective:   There were no vitals taken for this visit.  Patient is well-developed, well-nourished in no acute distress.  Resting comfortably at home.  Head is normocephalic, atraumatic.  No labored breathing.  Speech is clear and coherent with logical content.  Patient is alert and oriented at baseline.    Assessment and Plan:   Catherine Davis was seen today for cough and uri.  Diagnoses and all orders for this visit:  Cough in adult -      azithromycin  (ZITHROMAX ) 500 MG tablet; Take 1 tablet (500 mg total) by mouth daily for 3 days. -     HYDROcodone  bit-homatropine (HYCODAN) 5-1.5 MG/5ML syrup; Take 5 mLs by mouth every 8 (eight) hours as needed for cough.  Bronchitis -     azithromycin  (ZITHROMAX ) 500 MG tablet; Take 1 tablet (500 mg total) by mouth daily for 3 days. -     HYDROcodone  bit-homatropine (HYCODAN) 5-1.5 MG/5ML syrup; Take 5 mLs by mouth every 8 (eight) hours as needed for cough.      Acute bronchitis Persistent cough following upper respiratory infection, likely bronchitis. Cough is severe, causing urinary incontinence and vomiting. Initial fever resolved. Previous treatments with prednisone , promethazine  DM, and albuterol  were ineffective. Differential includes reflux, but caffeine cessation noted. Recent smoking cessation may contribute to symptoms. - Prescribed Zithromax  500 mg for 3 days. - Prescribed [place] cough syrup with hydrocodone , cautioned about sedation. - Advised to report if symptoms do not improve, as alternative diagnoses like reflux may need consideration.        Return if symptoms worsen or fail to improve.  Catherine Bruns, [facility] [address] [place], [place] [address] [phone]  08/05/2024  Time spent with the patient: 12 minutes, of which >50% was spent in obtaining information about symptoms, reviewing previous labs, evaluations, and treatments, counseling about condition (please see the discussed topics above), and developing a plan to further investigate it; had a number of questions which I addressed.

## 2024-10-16 ENCOUNTER — Other Ambulatory Visit: Payer: Self-pay | Admitting: Family Medicine

## 2024-10-16 DIAGNOSIS — F419 Anxiety disorder, unspecified: Secondary | ICD-10-CM

## 2024-10-16 DIAGNOSIS — F339 Major depressive disorder, recurrent, unspecified: Secondary | ICD-10-CM

## 2024-10-17 ENCOUNTER — Other Ambulatory Visit: Payer: Self-pay

## 2024-10-17 NOTE — Addendum Note (Signed)
 Addended by: INA RAMP D on: 10/17/2024 10:55 AM   Modules accepted: Orders

## 2024-10-17 NOTE — Telephone Encounter (Signed)
 Appt scheduled for 11/01/2024, please send refill to pharm

## 2024-10-17 NOTE — Telephone Encounter (Signed)
 Catherine Davis NTBS Last OV 10/20/23 NO RF sent to pharmacy last OV greater than a year

## 2024-10-18 ENCOUNTER — Other Ambulatory Visit (HOSPITAL_COMMUNITY): Payer: Self-pay

## 2024-10-18 MED ORDER — ESCITALOPRAM OXALATE 20 MG PO TABS
20.0000 mg | ORAL_TABLET | Freq: Every day | ORAL | 0 refills | Status: AC
  Filled 2024-10-18: qty 90, 90d supply, fill #0

## 2024-10-19 ENCOUNTER — Other Ambulatory Visit (HOSPITAL_COMMUNITY): Payer: Self-pay

## 2024-10-20 ENCOUNTER — Other Ambulatory Visit: Payer: Self-pay | Admitting: Family Medicine

## 2024-10-20 ENCOUNTER — Other Ambulatory Visit: Payer: Self-pay

## 2024-10-20 DIAGNOSIS — F419 Anxiety disorder, unspecified: Secondary | ICD-10-CM

## 2024-10-20 DIAGNOSIS — F339 Major depressive disorder, recurrent, unspecified: Secondary | ICD-10-CM

## 2024-10-20 MED ORDER — HYDROXYZINE HCL 10 MG PO TABS
10.0000 mg | ORAL_TABLET | Freq: Three times a day (TID) | ORAL | 0 refills | Status: AC | PRN
  Filled 2024-10-20: qty 30, 10d supply, fill #0

## 2024-11-01 ENCOUNTER — Ambulatory Visit: Admitting: Family Medicine

## 2024-11-02 ENCOUNTER — Ambulatory Visit: Admitting: Family Medicine

## 2024-11-10 ENCOUNTER — Encounter: Payer: Self-pay | Admitting: Family Medicine

## 2024-11-15 ENCOUNTER — Ambulatory Visit: Admitting: Family Medicine
